# Patient Record
Sex: Male | Born: 1945 | Race: White | Hispanic: No | Marital: Married | Smoking: Former smoker
Health system: Southern US, Community
[De-identification: ages and names within clinical notes are randomized; demographics above are authoritative.]

## PROBLEM LIST (undated history)

## (undated) DIAGNOSIS — I10 Essential (primary) hypertension: Secondary | ICD-10-CM

## (undated) DIAGNOSIS — E7800 Pure hypercholesterolemia, unspecified: Secondary | ICD-10-CM

## (undated) DIAGNOSIS — C801 Malignant (primary) neoplasm, unspecified: Secondary | ICD-10-CM

## (undated) HISTORY — PX: PROSTATE SURGERY: SHX751

## (undated) HISTORY — PX: HERNIA REPAIR: SHX51

## (undated) HISTORY — PX: TOTAL HIP ARTHROPLASTY: SHX124

## (undated) HISTORY — PX: REPLACEMENT TOTAL KNEE: SUR1224

---

## 2010-05-30 DIAGNOSIS — M75120 Complete rotator cuff tear or rupture of unspecified shoulder, not specified as traumatic: Secondary | ICD-10-CM | POA: Insufficient documentation

## 2010-07-10 DIAGNOSIS — M25519 Pain in unspecified shoulder: Secondary | ICD-10-CM | POA: Insufficient documentation

## 2010-08-16 DIAGNOSIS — M779 Enthesopathy, unspecified: Secondary | ICD-10-CM | POA: Insufficient documentation

## 2010-08-23 DIAGNOSIS — M67919 Unspecified disorder of synovium and tendon, unspecified shoulder: Secondary | ICD-10-CM | POA: Insufficient documentation

## 2011-09-16 DIAGNOSIS — D235 Other benign neoplasm of skin of trunk: Secondary | ICD-10-CM | POA: Insufficient documentation

## 2014-11-24 DEATH — deceased

## 2015-02-17 ENCOUNTER — Emergency Department (HOSPITAL_COMMUNITY): Payer: Medicare Other

## 2015-02-17 ENCOUNTER — Emergency Department (HOSPITAL_COMMUNITY)
Admission: EM | Admit: 2015-02-17 | Discharge: 2015-02-17 | Disposition: A | Discharge status: Home / Self Care | Payer: Medicare Other | Attending: Emergency Medicine | Admitting: Emergency Medicine

## 2015-02-17 ENCOUNTER — Encounter (HOSPITAL_COMMUNITY): Payer: Self-pay | Admitting: Emergency Medicine

## 2015-02-17 DIAGNOSIS — R109 Unspecified abdominal pain: Secondary | ICD-10-CM | POA: Diagnosis present

## 2015-02-17 DIAGNOSIS — N201 Calculus of ureter: Secondary | ICD-10-CM | POA: Diagnosis not present

## 2015-02-17 DIAGNOSIS — N23 Unspecified renal colic: Secondary | ICD-10-CM | POA: Diagnosis not present

## 2015-02-17 DIAGNOSIS — I10 Essential (primary) hypertension: Secondary | ICD-10-CM | POA: Diagnosis not present

## 2015-02-17 DIAGNOSIS — E7800 Pure hypercholesterolemia, unspecified: Secondary | ICD-10-CM | POA: Insufficient documentation

## 2015-02-17 DIAGNOSIS — Z7982 Long term (current) use of aspirin: Secondary | ICD-10-CM | POA: Insufficient documentation

## 2015-02-17 DIAGNOSIS — Z79899 Other long term (current) drug therapy: Secondary | ICD-10-CM | POA: Insufficient documentation

## 2015-02-17 DIAGNOSIS — Z8546 Personal history of malignant neoplasm of prostate: Secondary | ICD-10-CM | POA: Diagnosis not present

## 2015-02-17 HISTORY — DX: Essential (primary) hypertension: I10

## 2015-02-17 HISTORY — DX: Malignant (primary) neoplasm, unspecified: C80.1

## 2015-02-17 HISTORY — DX: Pure hypercholesterolemia, unspecified: E78.00

## 2015-02-17 LAB — LIPASE, BLOOD: LIPASE: 23 U/L (ref 11–51)

## 2015-02-17 LAB — CBC WITH DIFFERENTIAL/PLATELET
BASOS ABS: 0 10*3/uL (ref 0.0–0.1)
Basophils Relative: 0 %
Eosinophils Absolute: 0 10*3/uL (ref 0.0–0.7)
Eosinophils Relative: 0 %
HEMATOCRIT: 43.5 % (ref 39.0–52.0)
Hemoglobin: 15 g/dL (ref 13.0–17.0)
LYMPHS PCT: 8 %
Lymphs Abs: 1.1 10*3/uL (ref 0.7–4.0)
MCH: 32.3 pg (ref 26.0–34.0)
MCHC: 34.5 g/dL (ref 30.0–36.0)
MCV: 93.5 fL (ref 78.0–100.0)
MONO ABS: 0.5 10*3/uL (ref 0.1–1.0)
Monocytes Relative: 4 %
NEUTROS ABS: 11.1 10*3/uL — AB (ref 1.7–7.7)
Neutrophils Relative %: 88 %
Platelets: 210 10*3/uL (ref 150–400)
RBC: 4.65 MIL/uL (ref 4.22–5.81)
RDW: 13.2 % (ref 11.5–15.5)
WBC: 12.8 10*3/uL — AB (ref 4.0–10.5)

## 2015-02-17 LAB — URINALYSIS, ROUTINE W REFLEX MICROSCOPIC
Bilirubin Urine: NEGATIVE
GLUCOSE, UA: NEGATIVE mg/dL
Hgb urine dipstick: NEGATIVE
KETONES UR: NEGATIVE mg/dL
LEUKOCYTES UA: NEGATIVE
NITRITE: NEGATIVE
PROTEIN: NEGATIVE mg/dL
Specific Gravity, Urine: 1.02 (ref 1.005–1.030)
pH: 8 (ref 5.0–8.0)

## 2015-02-17 LAB — COMPREHENSIVE METABOLIC PANEL
ALT: 41 U/L (ref 17–63)
AST: 42 U/L — AB (ref 15–41)
Albumin: 5 g/dL (ref 3.5–5.0)
Alkaline Phosphatase: 52 U/L (ref 38–126)
Anion gap: 11 (ref 5–15)
BUN: 17 mg/dL (ref 6–20)
CHLORIDE: 104 mmol/L (ref 101–111)
CO2: 27 mmol/L (ref 22–32)
Calcium: 9.8 mg/dL (ref 8.9–10.3)
Creatinine, Ser: 0.83 mg/dL (ref 0.61–1.24)
GFR calc Af Amer: >60 mL/min (ref >60)
Glucose, Bld: 116 mg/dL — ABNORMAL HIGH (ref 65–99)
POTASSIUM: 3.9 mmol/L (ref 3.5–5.1)
SODIUM: 142 mmol/L (ref 135–145)
Total Bilirubin: 1 mg/dL (ref 0.3–1.2)
Total Protein: 8.3 g/dL — ABNORMAL HIGH (ref 6.5–8.1)

## 2015-02-17 MED ORDER — KETOROLAC TROMETHAMINE 30 MG/ML IJ SOLN
15.0000 mg | Freq: Once | INTRAMUSCULAR | Status: AC
  Administered 2015-02-17: 15 mg via INTRAVENOUS
  Filled 2015-02-17: qty 1

## 2015-02-17 MED ORDER — OXYCODONE-ACETAMINOPHEN 5-325 MG PO TABS: ORAL_TABLET | ORAL | Status: DC

## 2015-02-17 MED ORDER — MORPHINE SULFATE (PF) 2 MG/ML IV SOLN: 1.0000 mg | INTRAVENOUS | Status: DC | PRN

## 2015-02-17 MED ORDER — MORPHINE SULFATE (PF) 4 MG/ML IV SOLN
4.0000 mg | INTRAVENOUS | Status: DC | PRN
  Filled 2015-02-17: qty 1

## 2015-02-17 MED ORDER — TAMSULOSIN HCL 0.4 MG PO CAPS: 0.4000 mg | ORAL_CAPSULE | Freq: Every day | ORAL | Status: DC

## 2015-02-17 MED ORDER — ONDANSETRON HCL 4 MG/2ML IJ SOLN: 4.0000 mg | INTRAMUSCULAR | Status: DC | PRN

## 2015-02-17 MED ORDER — ONDANSETRON HCL 4 MG PO TABS: 4.0000 mg | ORAL_TABLET | Freq: Three times a day (TID) | ORAL | Status: DC | PRN

## 2015-02-17 MED ORDER — ONDANSETRON HCL 4 MG/2ML IJ SOLN
4.0000 mg | INTRAMUSCULAR | Status: DC | PRN
  Administered 2015-02-17: 4 mg via INTRAVENOUS
  Filled 2015-02-17: qty 2

## 2015-02-17 NOTE — ED Notes (Signed)
Pt states that last night he was having have some low back pain, walked around and it went away.  States that it came on suddenly on the left flank this morning.  Also became nauseated and vomited x1.

## 2015-02-17 NOTE — Discharge Instructions (Signed)
°Emergency Department Resource Guide °1) Find a Doctor and Pay Out of Pocket °Although you won't have to find out who is covered by your insurance plan, it is a good idea to ask around and get recommendations. You will then need to call the office and see if the doctor you have chosen will accept you as a new patient and what types of options they offer for patients who are self-pay. Some doctors offer discounts or will set up payment plans for their patients who do not have insurance, but you will need to ask so you aren't surprised when you get to your appointment. ° °2) Contact Your Local Health Department °Not all health departments have doctors that can see patients for sick visits, but many do, so it is worth a call to see if yours does. If you don't know where your local health department is, you can check in your phone book. The CDC also has a tool to help you locate your state's health department, and many state websites also have listings of all of their local health departments. ° °3) Find a Walk-in Clinic °If your illness is not likely to be very severe or complicated, you may want to try a walk in clinic. These are popping up all over the country in pharmacies, drugstores, and shopping centers. They're usually staffed by nurse practitioners or physician assistants that have been trained to treat common illnesses and complaints. They're usually fairly quick and inexpensive. However, if you have serious medical issues or chronic medical problems, these are probably not your best option. ° °No Primary Care Doctor: °- Call [facility] at  832-8000 - they can help you locate a primary care doctor that  accepts your insurance, provides certain services, etc. °- Physician Referral Service- 1-800-533-3463 ° °Chronic Pain Problems: °Organization         Address  Phone   Notes  °[facility]  (336) 297-2271 Patients need to be referred by their primary care doctor.  ° °Medication  Assistance: °Organization         Address  Phone   Notes  °[place] [facility] [address] °[place], [place] [address] (336) 641-8030 --Must be a resident of [place] °-- Must have NO insurance coverage whatsoever (no Medicaid/ Medicare, etc.) °-- The pt. MUST have a primary care doctor that directs their care regularly and follows them in the community °  °[facility]  (866) 331-1348   °[employer]  (888) 892-1162   ° °Agencies that provide inexpensive medical care: °Organization         Address  Phone   Notes  °[facility]  (336) 832-8035   °[facility]    (336) 832-7272   °[facility] [address] °[place], [place] [address] (336) 832-4777   °[facility] [address], °[place] (336) 271-4999   °[facility]    (336) 373-0678   °[facility]    (336) 272-1050   °[facility] ° [address], [place] Phone:  (336) 832-4444, Fax:  (336) 832-4440 Hours of Operation:  9 am - 6 pm, M-F.  Also accepts Medicaid/Medicare and self-pay.  °[facility] ° [address], [place] Phone: (336) 832-3150, Fax: (336) 832-3151. Hours of Operation:  8:30 am - 5:30 pm, M-F.  Also accepts Medicaid and self-pay.  °[facility] [place] [address], [place] Phone: (336) 878-6027   °[facility] [address], [place], [place] (336)723-1848, Ext. 123 Mondays & Thursdays: 7-9 AM.  First 15 patients are seen on a first come, first serve basis. °  ° °Medicaid-accepting [place] Providers: ° °Organization         Address  Phone   Notes  °[facility] [address], [place] (336) 641-2100 Also accepts self-pay patients.  °[facility] [address], [place] ° (336) 856-9996   °[facility] [address], [place]  (336) 288-8857   °[facility] [address] [place] [address], [place] (336) 299-7000   °Veita Bland [address], [place]  ° (336) 373-1557 Only accepts [place] Access Medicaid patients after they have their name applied to their card.  ° °Self-Pay (no insurance) in [place]: ° °Organization         Address  Phone   Notes  °Sickle Cell Patients, [facility] [address], [place] (336) 832-1970   °[facility] [address], [place] (336) 832-4400   °[facility] ° [address], [place] (336) 992-4800   °[facility]/Dr. Osei-Bonsu ° [address] [place] [address], [place] or [address], [place] (336) 841-8500 Phone number for both [place] and [place] locations is the same.  °[facility] [address], [place] (336) 299-0000   °[facility] [address] [place] [address], [place] or [address] (336) 852-7530 °(336) 878-2260   °[facility] [address], [place] (336) 350-1642, phone; (336) 294-5005, fax Sees patients 1st and 3rd Saturday of every month.  Must not qualify for public or private insurance (i.e. Medicaid, Medicare, [place] Health Choice, Veterans' Benefits) • Household income should be no more than 200% of the poverty level •The clinic cannot treat you if you are pregnant or think you are pregnant • Sexually transmitted diseases are not treated at the clinic.  ° ° °Dental Care: °Organization         Address  Phone  Notes  °[place] [facility] [address], [place] (336) 641-6152 Accepts children up to age 21 who are enrolled in Medicaid or [place] Health Choice; pregnant women with a Medicaid card; and children who have applied for Medicaid or [place] Health Choice, but were declined, whose parents can pay a reduced fee at time of service.  °[place]  [facility] [place]  [address], [place] (336) 641-7733 Accepts children up to age 21 who are enrolled in Medicaid or [place] Health Choice; pregnant women with a Medicaid card; and children who have applied for Medicaid or [place] Health Choice, but were declined, whose parents can pay a reduced fee at time of service.  °[facility] ° [address],  (336) 641-4533 Patients are seen by appointment only. Walk-ins are not accepted. [facility] will see patients 18 years of age and older. °Monday - Tuesday (8am-5pm) °Most Wednesdays (8:30-5pm) °$30 per visit, cash only  °[facility] ° [address], [place] (336) 641-4533 Patients are seen by appointment only. Walk-ins are not accepted. [facility] will see patients 18 years of age and older. °One   Wednesday Evening (Monthly: Volunteer Based).  $30 per visit, cash only  °[facility]  (919) 537-3737 for adults; Children under age 4, call Graduate Pediatric Dentistry at (919) [phone]. Children aged 4-14, please call (919) [phone] to request a pediatric application. ° Dental services are provided in all areas of dental care including fillings, crowns and bridges, complete and partial dentures, implants, gum treatment, root canals, and extractions. Preventive care is also provided. Treatment is provided to both adults and children. °Patients are selected via a lottery and there is often a waiting list. °  °[facility] [address], °[place] ° (336) 763-8833 [email or web address] °  °[facility] [address], [place], [place] (336)723-1848, Ext. 123 Second and Fourth Thursday of each month, opens at 6:30 AM; Clinic ends at 9 AM.  Patients are seen on a first-come first-served basis, and a limited number are seen during each clinic.  ° °[facility] ° [address], [place], [place] (336) 723-7904    Eligibility Requirements °You must have lived in [place], [place], or [place] counties for at least the last three months. °  You cannot be eligible for state or federal sponsored healthcare insurance, including Veterans Administration, Medicaid, or Medicare. °  You generally cannot be eligible for healthcare insurance through your employer.  °  How to apply: °Eligibility screenings are held every Tuesday and Wednesday afternoon from 1:00 pm until 4:00 pm. You do not need an appointment for the interview!  °[facility] [address], [place], [place] 336-631-2330   °[facility]  336-342-8273   °[facility]  336-703-3100   °[facility]  336-570-6415   ° °Behavioral Health Resources in the Community: °Intensive Outpatient Programs °Organization         Address  Phone  Notes  °[facility] [address], [place], [place] 336-878-6098   °[facility] [address], [place], [place] 336-832-9800   °[facility] [address], [place], [place] ° 336-882-2125   °[facility] [address],  °[place], [place] 1-800-853-5163 or 336-641-4981   °Substance Abuse Resources °Organization         Address  Phone  Notes  °[facility]  336-882-2125   °[facility]  336-784-9470   °[facility]  336-285-9073   °[facility]  336-845-3988   °Residential & Outpatient Substance Abuse Program  1-800-659-3381   °Psychological Services °Organization         Address  Phone  Notes  °[facility]  336- 832-9600   °[facility]  336- 378-7881   °[facility] [address], [place] 1-800-853-5163 or 336-641-4981   ° °Mobile Crisis Teams °Organization         Address  Phone  Notes  °[facility], Mobile Crisis Care Unit  1-877-626-1772   °Assertive °[facility] ° [address]  [place], [place] 336-834-9664   °Sharon DeEsch [address] °[place] 336-554-5454   ° °Self-Help/Support Groups °Organization         Address  Phone             Notes  °[facility] [place] - variety of support groups  336- 373-1402 Call for more information  °Narcotics Anonymous (NA), [facility] [address], °[place]   2 meetings at this location  ° °  Residential Treatment Programs Organization         Address  Phone  Notes  [facility] [address],    [place]  1-479-228-1678   [facility]  [address], [place] [id number], [place], [place]   [place], [place] 330-460-9025 Admissions: 8am-3pm M-F  [facility] [address],    [place], [place] [phone]   [facility] [address] [place], [place], [place]   [facility] [address],  [place], [place]   [facility] Intensive Outpatient [address], [place], [place]   [facility]  [place], [place] 1-640-801-7903 or (301) 408-1283   [facility] [address], [place], [place] Accepts Medicaid  [facility] [address]  [place] 1-502-004-2231 Substance Abuse/Addiction Treatment   [facility]         Address  Phone  Notes  [facility]  662-596-2468   Domenic Schwab, PhD [address] [place]a [place], [place]   (204) 030-1048 or 848-680-0206   [facility] [place], [place] 765-174-0018   [facility] [address] [place], [place] [phone] Insurance/Medicaid/sponsorship through [facility] and Families [address]                                    [place], [place] [phone] [facility] [address], [place] [phone]    Dr. Adele Schilder  [phone]   [facility] Dept. 1) [address] [place] 2) [place] 3)  [address], [place] [phone]  [phone]   [place] 805-209-7388 or [phone] (After Hours)      Take the prescriptions as directed.  Also take over the counter ibuprofen, 2 tablets by mouth every 4 hours with food, for the next week.  Call the Urologist Monday to schedule a follow up appointment next week.  Return to the Emergency Department immediately if worsening.

## 2015-02-17 NOTE — ED Provider Notes (Signed)
CSN: [id number]     Arrival date & time 02/17/15  1135 History   First MD Initiated Contact with Patient 02/17/15 1207     Chief Complaint  Patient presents with  . Flank Pain      HPI  Pt was seen at 1220. Per pt, c/o sudden onset and persistence of waxing and waning left sided flank "pain" that began last night.  Pt describes the pain as "aching," and radiating into the left side of his abd.  Has been associated with multiple intermittent episodes of N/V.  Denies testicular pain/swelling, no dysuria/hematuria, no abd pain, no diarrhea, no black or blood in emesis, no CP/SOB, no fevers, no rash.    Past Medical History  Diagnosis Date  . High cholesterol   . Hypertension   . Cancer [facility])     prostate   Past Surgical History  Procedure Laterality Date  . Hernia repair    . Replacement total knee    . Prostate surgery      Social History  Substance Use Topics  . Smoking status: Never Smoker   . Smokeless tobacco: None  . Alcohol Use: Yes     Comment: occasional    Review of Systems ROS: Statement: All systems negative except as marked or noted in the HPI; Constitutional: Negative for fever and chills. ; ; Eyes: Negative for eye pain, redness and discharge. ; ; ENMT: Negative for ear pain, hoarseness, nasal congestion, sinus pressure and sore throat. ; ; Cardiovascular: Negative for chest pain, palpitations, diaphoresis, dyspnea and peripheral edema. ; ; Respiratory: Negative for cough, wheezing and stridor. ; ; Gastrointestinal: +N/V. Negative for diarrhea, abdominal pain, blood in stool, hematemesis, jaundice and rectal bleeding. . ; ; Genitourinary: +flank pain. Negative for dysuria and hematuria. ; Genital:  No penile drainage or rash, no testicular pain or swelling, no scrotal rash or swelling. ;; ; Musculoskeletal: Negative for neck pain. Negative for swelling and trauma.; ; Skin: Negative for pruritus, rash, abrasions, blisters, bruising and skin lesion.; ; Neuro: Negative  for headache, lightheadedness and neck stiffness. Negative for weakness, altered level of consciousness , altered mental status, extremity weakness, paresthesias, involuntary movement, seizure and syncope.      Allergies  Dilaudid  Home Medications   Prior to Admission medications   Medication Sig Start Date End Date Taking? Authorizing Provider  amLODipine (NORVASC) 5 MG tablet Take 5 mg by mouth daily.  12/25/14  Yes Historical Provider, MD  aspirin EC 81 MG tablet Take 81 mg by mouth daily.   Yes Historical Provider, MD  Omega-3 Fatty Acids (FISH OIL PO) Take 1 capsule by mouth daily.   Yes Historical Provider, MD  VYTORIN 10-40 MG tablet Take 1 tablet by mouth daily.  01/09/15  Yes Historical Provider, MD   BP 145/90 mmHg  Pulse 54  Temp(Src) 97.6 F (36.4 C) (Oral)  Resp 18  Ht 6' (1.829 m)  Wt 195 lb (88.451 kg)  BMI 26.44 kg/m2  SpO2 100% Physical Exam  1225: Physical examination:  Nursing notes reviewed; Vital signs and O2 SAT reviewed;  Constitutional: Well developed, Well nourished, Well hydrated, Uncomfortable appearing.; Head:  Normocephalic, atraumatic; Eyes: EOMI, PERRL, No scleral icterus; ENMT: Mouth and pharynx normal, Mucous membranes moist; Neck: Supple, Full range of motion, No lymphadenopathy; Cardiovascular: Regular rate and rhythm, No gallop; Respiratory: Breath sounds clear & equal bilaterally, No wheezes.  Speaking full sentences with ease, Normal respiratory effort/excursion; Chest: Nontender, Movement normal; Abdomen: Soft, Nontender, Nondistended, Normal  bowel sounds; Genitourinary: No CVA tenderness; Spine:  No midline CS, TS, LS tenderness.;; Extremities: Pulses normal, No tenderness, No edema, No calf edema or asymmetry.; Neuro: AA&Ox3, Major CN grossly intact.  Speech clear. No gross focal motor or sensory deficits in extremities.; Skin: Color normal, Warm, Dry.   ED Course  Procedures (including critical care time) Labs Review   Imaging Review  I  have personally reviewed and evaluated these images and lab results as part of my medical decision-making.   EKG Interpretation None      MDM  MDM Reviewed: previous chart, nursing note and vitals Reviewed previous: labs Interpretation: labs and CT scan     Results for orders placed or performed during the hospital encounter of 02/17/15  Urinalysis, Routine w reflex microscopic  Result Value Ref Range   Color, Urine YELLOW YELLOW   APPearance CLEAR CLEAR   Specific Gravity, Urine 1.020 1.005 - 1.030   pH 8.0 5.0 - 8.0   Glucose, UA NEGATIVE NEGATIVE mg/dL   Hgb urine dipstick NEGATIVE NEGATIVE   Bilirubin Urine NEGATIVE NEGATIVE   Ketones, ur NEGATIVE NEGATIVE mg/dL   Protein, ur NEGATIVE NEGATIVE mg/dL   Nitrite NEGATIVE NEGATIVE   Leukocytes, UA NEGATIVE NEGATIVE  Comprehensive metabolic panel  Result Value Ref Range   Sodium 142 135 - 145 mmol/L   Potassium 3.9 3.5 - 5.1 mmol/L   Chloride 104 101 - 111 mmol/L   CO2 27 22 - 32 mmol/L   Glucose, Bld 116 (H) 65 - 99 mg/dL   BUN 17 6 - 20 mg/dL   Creatinine, Ser 0.83 0.61 - 1.24 mg/dL   Calcium 9.8 8.9 - 10.3 mg/dL   Total Protein 8.3 (H) 6.5 - 8.1 g/dL   Albumin 5.0 3.5 - 5.0 g/dL   AST 42 (H) 15 - 41 U/L   ALT 41 17 - 63 U/L   Alkaline Phosphatase 52 38 - 126 U/L   Total Bilirubin 1.0 0.3 - 1.2 mg/dL   GFR calc non Af Amer >60 >60 mL/min   GFR calc Af Amer >60 >60 mL/min   Anion gap 11 5 - 15  Lipase, blood  Result Value Ref Range   Lipase 23 11 - 51 U/L  CBC with Differential  Result Value Ref Range   WBC 12.8 (H) 4.0 - 10.5 K/uL   RBC 4.65 4.22 - 5.81 MIL/uL   Hemoglobin 15.0 13.0 - 17.0 g/dL   HCT 43.5 39.0 - 52.0 %   MCV 93.5 78.0 - 100.0 fL   MCH 32.3 26.0 - 34.0 pg   MCHC 34.5 30.0 - 36.0 g/dL   RDW 13.2 11.5 - 15.5 %   Platelets 210 150 - 400 K/uL   Neutrophils Relative % 88 %   Neutro Abs 11.1 (H) 1.7 - 7.7 K/uL   Lymphocytes Relative 8 %   Lymphs Abs 1.1 0.7 - 4.0 K/uL   Monocytes Relative 4  %   Monocytes Absolute 0.5 0.1 - 1.0 K/uL   Eosinophils Relative 0 %   Eosinophils Absolute 0.0 0.0 - 0.7 K/uL   Basophils Relative 0 %   Basophils Absolute 0.0 0.0 - 0.1 K/uL   Ct Renal Stone Study 02/17/2015  CLINICAL DATA:  Acute left flank pain. EXAM: CT ABDOMEN AND PELVIS WITHOUT CONTRAST TECHNIQUE: Multidetector CT imaging of the abdomen and pelvis was performed following the standard protocol without IV contrast. COMPARISON:  None. 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 Visualized lung  bases are unremarkable. Mild cholelithiasis is noted. Multiple hepatic cysts are noted. The spleen and pancreas appear normal. Adrenal glands appear normal. Large simple cyst is seen involving lower pole of right kidney. Bilateral nephrolithiasis is noted. Mild left hydroureteronephrosis with perinephric stranding is noted secondary to 4 mm distal left ureteral calculus. The appendix appears normal. Atherosclerosis of abdominal aorta is noted without aneurysm formation. No abnormal fluid collection is noted. There is no evidence of bowel obstruction. Sigmoid diverticulosis is noted without inflammation. Urinary bladder appears normal. No significant adenopathy is noted. IMPRESSION: Mild cholelithiasis. Atherosclerosis of abdominal aorta without aneurysm formation. Bilateral nephrolithiasis. Mild left hydroureteronephrosis with perinephric stranding is noted secondary to 4 mm distal left ureteral calculus. Electronically Signed   By: Marijo Conception, M.D.   On: 02/17/2015 12:59    1345:  Pt continues to c/o pain and nausea. Apparently refused pain meds "because I'm scared of them." Pt encouraged to take IV pain/nausea meds to initally control his symptoms, then will be able to tol PO meds. Pt and family verb understanding and will accept very low dose IV morphine. Will also re-dose IV zofran with IV toradol.   1450:  Pt feels better after meds and wants to go home now. Has  tol PO well without N/V. Tx symptomatically at this time. Dx and testing d/w pt and family.  Questions answered.  Verb understanding, agreeable to d/c home with outpt f/u.   Francine Graven, DO 02/21/15 1913

## 2017-06-12 DIAGNOSIS — I10 Essential (primary) hypertension: Secondary | ICD-10-CM | POA: Insufficient documentation

## 2017-06-12 DIAGNOSIS — E7849 Other hyperlipidemia: Secondary | ICD-10-CM | POA: Insufficient documentation

## 2017-11-28 DIAGNOSIS — Z96653 Presence of artificial knee joint, bilateral: Secondary | ICD-10-CM | POA: Insufficient documentation

## 2019-01-19 LAB — HM HEPATITIS C SCREENING LAB: HM Hepatitis Screen: NEGATIVE

## 2019-08-05 DIAGNOSIS — R200 Anesthesia of skin: Secondary | ICD-10-CM | POA: Insufficient documentation

## 2019-08-05 DIAGNOSIS — G8929 Other chronic pain: Secondary | ICD-10-CM | POA: Insufficient documentation

## 2019-10-18 DIAGNOSIS — G5603 Carpal tunnel syndrome, bilateral upper limbs: Secondary | ICD-10-CM | POA: Insufficient documentation

## 2019-10-18 DIAGNOSIS — M4802 Spinal stenosis, cervical region: Secondary | ICD-10-CM | POA: Insufficient documentation

## 2019-12-05 DIAGNOSIS — M7502 Adhesive capsulitis of left shoulder: Secondary | ICD-10-CM | POA: Insufficient documentation

## 2019-12-05 DIAGNOSIS — M67922 Unspecified disorder of synovium and tendon, left upper arm: Secondary | ICD-10-CM | POA: Insufficient documentation

## 2019-12-05 DIAGNOSIS — M4802 Spinal stenosis, cervical region: Secondary | ICD-10-CM | POA: Insufficient documentation

## 2019-12-05 DIAGNOSIS — M50122 Cervical disc disorder at C5-C6 level with radiculopathy: Secondary | ICD-10-CM | POA: Insufficient documentation

## 2020-09-05 ENCOUNTER — Other Ambulatory Visit: Payer: Self-pay | Admitting: Neurosurgery

## 2020-09-05 DIAGNOSIS — G9520 Unspecified cord compression: Secondary | ICD-10-CM

## 2020-09-10 ENCOUNTER — Ambulatory Visit
Admission: RE | Admit: 2020-09-10 | Discharge: 2020-09-10 | Disposition: A | Discharge status: Home / Self Care | Payer: Medicare Other | Source: Ambulatory Visit | Attending: Neurosurgery | Admitting: Neurosurgery

## 2020-09-10 DIAGNOSIS — G9520 Unspecified cord compression: Secondary | ICD-10-CM

## 2021-03-25 HISTORY — PX: OTHER SURGICAL HISTORY: SHX169

## 2021-05-22 LAB — LIPID PANEL
Cholesterol: 133 (ref 0–200)
HDL: 56 (ref 35–70)
LDL Cholesterol: 55
Triglycerides: 108 (ref 40–160)

## 2021-10-05 NOTE — Telephone Encounter (Signed)
NOTE NOT NEEDED ?

## 2021-12-25 ENCOUNTER — Encounter: Payer: Self-pay | Admitting: Internal Medicine

## 2021-12-25 ENCOUNTER — Ambulatory Visit (INDEPENDENT_AMBULATORY_CARE_PROVIDER_SITE_OTHER): Payer: Medicare Other | Admitting: Internal Medicine

## 2021-12-25 VITALS — BP 160/92 | HR 64 | Temp 98.1°F | Resp 16 | Ht 72.0 in | Wt 198.0 lb

## 2021-12-25 DIAGNOSIS — I10 Essential (primary) hypertension: Secondary | ICD-10-CM

## 2021-12-25 DIAGNOSIS — R9431 Abnormal electrocardiogram [ECG] [EKG]: Secondary | ICD-10-CM

## 2021-12-25 DIAGNOSIS — Z23 Encounter for immunization: Secondary | ICD-10-CM

## 2021-12-25 DIAGNOSIS — E785 Hyperlipidemia, unspecified: Secondary | ICD-10-CM | POA: Diagnosis not present

## 2021-12-25 DIAGNOSIS — I5032 Chronic diastolic (congestive) heart failure: Secondary | ICD-10-CM | POA: Diagnosis not present

## 2021-12-25 LAB — URINALYSIS, ROUTINE W REFLEX MICROSCOPIC
Bilirubin Urine: NEGATIVE
Ketones, ur: NEGATIVE
Leukocytes,Ua: NEGATIVE
Nitrite: NEGATIVE
Specific Gravity, Urine: 1.015 (ref 1.000–1.030)
Total Protein, Urine: NEGATIVE
Urine Glucose: NEGATIVE
Urobilinogen, UA: 0.2 (ref 0.0–1.0)
WBC, UA: NONE SEEN (ref 0–?)
pH: 6 (ref 5.0–8.0)

## 2021-12-25 LAB — CBC WITH DIFFERENTIAL/PLATELET
Basophils Absolute: 0.1 10*3/uL (ref 0.0–0.1)
Basophils Relative: 0.7 % (ref 0.0–3.0)
Eosinophils Absolute: 0.2 10*3/uL (ref 0.0–0.7)
Eosinophils Relative: 2.1 % (ref 0.0–5.0)
HCT: 43.4 % (ref 39.0–52.0)
Hemoglobin: 14.6 g/dL (ref 13.0–17.0)
Lymphocytes Relative: 25.4 % (ref 12.0–46.0)
Lymphs Abs: 1.8 10*3/uL (ref 0.7–4.0)
MCHC: 33.6 g/dL (ref 30.0–36.0)
MCV: 95 fl (ref 78.0–100.0)
Monocytes Absolute: 0.6 10*3/uL (ref 0.1–1.0)
Monocytes Relative: 8.5 % (ref 3.0–12.0)
Neutro Abs: 4.5 10*3/uL (ref 1.4–7.7)
Neutrophils Relative %: 63.3 % (ref 43.0–77.0)
Platelets: 195 10*3/uL (ref 150.0–400.0)
RBC: 4.57 Mil/uL (ref 4.22–5.81)
RDW: 13.3 % (ref 11.5–15.5)
WBC: 7.1 10*3/uL (ref 4.0–10.5)

## 2021-12-25 LAB — BASIC METABOLIC PANEL
BUN: 15 mg/dL (ref 6–23)
CO2: 22 mEq/L (ref 19–32)
Calcium: 9.8 mg/dL (ref 8.4–10.5)
Chloride: 105 mEq/L (ref 96–112)
Creatinine, Ser: 0.74 mg/dL (ref 0.40–1.50)
GFR: 87.71 mL/min (ref >60.00)
Glucose, Bld: 87 mg/dL (ref 70–99)
Potassium: 3.7 mEq/L (ref 3.5–5.1)
Sodium: 140 mEq/L (ref 135–145)

## 2021-12-25 LAB — HEPATIC FUNCTION PANEL
ALT: 30 U/L (ref 0–53)
AST: 31 U/L (ref 0–37)
Albumin: 4.8 g/dL (ref 3.5–5.2)
Alkaline Phosphatase: 44 U/L (ref 39–117)
Bilirubin, Direct: 0.1 mg/dL (ref 0.0–0.3)
Total Bilirubin: 0.7 mg/dL (ref 0.2–1.2)
Total Protein: 8 g/dL (ref 6.0–8.3)

## 2021-12-25 LAB — TSH: TSH: 3.64 u[IU]/mL (ref 0.35–5.50)

## 2021-12-25 MED ORDER — LOSARTAN POTASSIUM 50 MG PO TABS: 50.0000 mg | ORAL_TABLET | Freq: Every day | ORAL | 1 refills | Status: DC

## 2021-12-25 MED ORDER — AMLODIPINE BESYLATE 5 MG PO TABS: 5.0000 mg | ORAL_TABLET | Freq: Every day | ORAL | 1 refills | Status: DC

## 2021-12-25 NOTE — Patient Instructions (Signed)
Hypertension, Adult High blood pressure (hypertension) is when the force of blood pumping through the arteries is too strong. The arteries are the blood vessels that carry blood from the heart throughout the body. Hypertension forces the heart to work harder to pump blood and may cause arteries to become narrow or stiff. Untreated or uncontrolled hypertension can lead to a heart attack, heart failure, a stroke, kidney disease, and other problems. A blood pressure reading consists of a higher number over a lower number. Ideally, your blood pressure should be below 120/80. The first ("top") number is called the systolic pressure. It is a measure of the pressure in your arteries as your heart beats. The second ("bottom") number is called the diastolic pressure. It is a measure of the pressure in your arteries as the heart relaxes. What are the causes? The exact cause of this condition is not known. There are some conditions that result in high blood pressure. What increases the risk? Certain factors may make you more likely to develop high blood pressure. Some of these risk factors are under your control, including: Smoking. Not getting enough exercise or physical activity. Being overweight. Having too much fat, sugar, calories, or salt (sodium) in your diet. Drinking too much alcohol. Other risk factors include: Having a personal history of heart disease, diabetes, high cholesterol, or kidney disease. Stress. Having a family history of high blood pressure and high cholesterol. Having obstructive sleep apnea. Age. The risk increases with age. What are the signs or symptoms? High blood pressure may not cause symptoms. Very high blood pressure (hypertensive crisis) may cause: Headache. Fast or irregular heartbeats (palpitations). Shortness of breath. Nosebleed. Nausea and vomiting. Vision changes. Severe chest pain, dizziness, and seizures. How is this diagnosed? This condition is diagnosed by  measuring your blood pressure while you are seated, with your arm resting on a flat surface, your legs uncrossed, and your feet flat on the floor. The cuff of the blood pressure monitor will be placed directly against the skin of your upper arm at the level of your heart. Blood pressure should be measured at least twice using the same arm. Certain conditions can cause a difference in blood pressure between your right and left arms. If you have a high blood pressure reading during one visit or you have normal blood pressure with other risk factors, you may be asked to: Return on a different day to have your blood pressure checked again. Monitor your blood pressure at home for 1 week or longer. If you are diagnosed with hypertension, you may have other blood or imaging tests to help your health care provider understand your overall risk for other conditions. How is this treated? This condition is treated by making healthy lifestyle changes, such as eating healthy foods, exercising more, and reducing your alcohol intake. You may be referred for counseling on a healthy diet and physical activity. Your health care provider may prescribe medicine if lifestyle changes are not enough to get your blood pressure under control and if: Your systolic blood pressure is above 130. Your diastolic blood pressure is above 80. Your personal target blood pressure may vary depending on your medical conditions, your age, and other factors. Follow these instructions at home: Eating and drinking  Eat a diet that is high in fiber and potassium, and low in sodium, added sugar, and fat. An example of this eating plan is called the DASH diet. DASH stands for Dietary Approaches to Stop Hypertension. To eat this way: Eat   plenty of fresh fruits and vegetables. Try to fill one half of your plate at each meal with fruits and vegetables. Eat whole grains, such as whole-wheat pasta, brown rice, or whole-grain bread. Fill about one  fourth of your plate with whole grains. Eat or drink low-fat dairy products, such as skim milk or low-fat yogurt. Avoid fatty cuts of meat, processed or cured meats, and poultry with skin. Fill about one fourth of your plate with lean proteins, such as fish, chicken without skin, beans, eggs, or tofu. Avoid pre-made and processed foods. These tend to be higher in sodium, added sugar, and fat. Reduce your daily sodium intake. Many people with hypertension should eat less than 1,500 mg of sodium a day. Do not drink alcohol if: Your health care provider tells you not to drink. You are pregnant, may be pregnant, or are planning to become pregnant. If you drink alcohol: Limit how much you have to: 0-1 drink a day for women. 0-2 drinks a day for men. Know how much alcohol is in your drink. In the [place] one drink equals one 12 oz bottle of beer (355 mL), one 5 oz glass of wine (148 mL), or one 1 oz glass of hard liquor (44 mL). Lifestyle  Work with your health care provider to maintain a healthy body weight or to lose weight. Ask what an ideal weight is for you. Get at least 30 minutes of exercise that causes your heart to beat faster (aerobic exercise) most days of the week. Activities may include walking, swimming, or biking. Include exercise to strengthen your muscles (resistance exercise), such as Pilates or lifting weights, as part of your weekly exercise routine. Try to do these types of exercises for 30 minutes at least 3 days a week. Do not use any products that contain nicotine or tobacco. These products include cigarettes, chewing tobacco, and vaping devices, such as e-cigarettes. If you need help quitting, ask your health care provider. Monitor your blood pressure at home as told by your health care provider. Keep all follow-up visits. This is important. Medicines Take over-the-counter and prescription medicines only as told by your health care provider. Follow directions carefully. Blood  pressure medicines must be taken as prescribed. Do not skip doses of blood pressure medicine. Doing this puts you at risk for problems and can make the medicine less effective. Ask your health care provider about side effects or reactions to medicines that you should watch for. Contact a health care provider if you: Think you are having a reaction to a medicine you are taking. Have headaches that keep coming back (recurring). Feel dizzy. Have swelling in your ankles. Have trouble with your vision. Get help right away if you: Develop a severe headache or confusion. Have unusual weakness or numbness. Feel faint. Have severe pain in your chest or abdomen. Vomit repeatedly. Have trouble breathing. These symptoms may be an emergency. Get help right away. Call 911. Do not wait to see if the symptoms will go away. Do not drive yourself to the hospital. Summary Hypertension is when the force of blood pumping through your arteries is too strong. If this condition is not controlled, it may put you at risk for serious complications. Your personal target blood pressure may vary depending on your medical conditions, your age, and other factors. For most people, a normal blood pressure is less than 120/80. Hypertension is treated with lifestyle changes, medicines, or a combination of both. Lifestyle changes include losing weight, eating a healthy,   low-sodium diet, exercising more, and limiting alcohol. This information is not intended to replace advice given to you by your health care provider. Make sure you discuss any questions you have with your health care provider. Document Revised: 01/16/2021 Document Reviewed: 01/16/2021 Elsevier Patient Education  2023 Elsevier Inc.  

## 2021-12-25 NOTE — Progress Notes (Signed)
Subjective:  Patient ID: Jonathan Ritter, male    DOB: Oct 22, 1945  Age: 76 y.o. MRN: [id number]  CC: Hypertension   HPI Jonathan Ritter presents for establishing -  He does not exercise but when he is active he does not experience DOE, CP, edema, or palpitations.  History Jonathan Ritter has a past medical history of Cancer ([place]), High cholesterol, and Hypertension.   He has a past surgical history that includes Hernia repair; Replacement total knee; and Prostate surgery.   His family history includes Alcoholism in his brother and mother; CAD in his father; Stroke in his mother.He reports that he has never smoked. He does not have any smokeless tobacco history on file. He reports current alcohol use of about 7.0 standard drinks of alcohol per week. He reports that he does not use drugs.  Outpatient Medications Prior to Visit  Medication Sig Dispense Refill   aspirin EC 81 MG tablet Take 81 mg by mouth daily.     Omega-3 Fatty Acids (FISH OIL PO) Take 1 capsule by mouth daily.     VYTORIN 10-40 MG tablet Take 1 tablet by mouth daily.      amLODipine (NORVASC) 5 MG tablet Take 5 mg by mouth daily.      losartan (COZAAR) 25 MG tablet Take 25 mg by mouth daily.     ondansetron (ZOFRAN) 4 MG tablet Take 1 tablet (4 mg total) by mouth every 8 (eight) hours as needed for nausea or vomiting. 6 tablet 0   oxyCODONE-acetaminophen (PERCOCET/ROXICET) 5-325 MG tablet 1 or 2 tabs PO q6h prn pain 20 tablet 0   tamsulosin (FLOMAX) 0.4 MG CAPS capsule Take 1 capsule (0.4 mg total) by mouth at bedtime. 5 capsule 0   No facility-administered medications prior to visit.    ROS Review of Systems  Constitutional:  Negative for chills, diaphoresis, fatigue and fever.  HENT: Negative.    Eyes: Negative.   Respiratory:  Negative for cough, chest tightness, shortness of breath and wheezing.   Cardiovascular:  Negative for chest pain, palpitations and leg swelling.  Gastrointestinal:  Negative for abdominal pain,  constipation, diarrhea, nausea and vomiting.  Endocrine: Negative.   Genitourinary: Negative.  Negative for difficulty urinating.  Musculoskeletal:  Positive for arthralgias and neck pain. Negative for joint swelling and myalgias.  Skin: Negative.  Negative for color change.  Neurological:  Negative for dizziness, weakness, light-headedness, numbness and headaches.  Hematological:  Negative for adenopathy. Does not bruise/bleed easily.  Psychiatric/Behavioral: Negative.      Objective:  BP (!) 160/92 (BP Location: Right Arm, Patient Position: Sitting, Cuff Size: Large)   Pulse 64   Temp 98.1 F (36.7 C) (Oral)   Resp 16   Ht 6' (1.829 m)   Wt 198 lb (89.8 kg)   SpO2 97%   BMI 26.85 kg/m   Physical Exam Vitals reviewed.  HENT:     Nose: Nose normal.     Mouth/Throat:     Mouth: Mucous membranes are moist.  Eyes:     General: No scleral icterus.    Conjunctiva/sclera: Conjunctivae normal.  Cardiovascular:     Rate and Rhythm: Regular rhythm. Bradycardia present.     Heart sounds: Normal heart sounds, S1 normal and S2 normal. No murmur heard.    No gallop.     Comments: EKG- SB, 56 bpm Anterior infarct pattern No old EKG's to compare Pulmonary:     Effort: Pulmonary effort is normal.     Breath sounds: No stridor.  No wheezing, rhonchi or rales.  Musculoskeletal:     Right lower leg: No edema.     Left lower leg: No edema.  Neurological:     Mental Status: He is alert.      Lab Results  Component Value Date   WBC 7.1 12/25/2021   HGB 14.6 12/25/2021   HCT 43.4 12/25/2021   PLT 195.0 12/25/2021   GLUCOSE 87 12/25/2021   CHOL 133 05/22/2021   TRIG 108 05/22/2021   HDL 56 05/22/2021   LDLCALC 55 05/22/2021   ALT 30 12/25/2021   AST 31 12/25/2021   NA 140 12/25/2021   K 3.7 12/25/2021   CL 105 12/25/2021   CREATININE 0.74 12/25/2021   BUN 15 12/25/2021   CO2 22 12/25/2021   TSH 3.64 12/25/2021      Assessment & Plan:   Jonathan Ritter was seen today for  hypertension.  Diagnoses and all orders for this visit:  Flu vaccine need -     Flu Vaccine QUAD High Dose(Fluad)  Essential hypertension- His BP is not adequately well controlled. Will increase the ARB dose. -     EKG 12-Lead -     Basic metabolic panel; Future -     TSH; Future -     Urinalysis, Routine w reflex microscopic; Future -     CBC with Differential/Platelet; Future -     Hepatic function panel; Future -     Hepatic function panel -     CBC with Differential/Platelet -     Urinalysis, Routine w reflex microscopic -     TSH -     Basic metabolic panel -     amLODipine (NORVASC) 5 MG tablet; Take 1 tablet (5 mg total) by mouth daily. -     losartan (COZAAR) 50 MG tablet; Take 1 tablet (50 mg total) by mouth daily.  Hyperlipidemia with target LDL less than 130- LDL goal achieved. Doing well on the statin  -     Hepatic function panel; Future -     Hepatic function panel  Abnormal electrocardiogram (ECG) (EKG)- His ASCVD risk score is 39%. Will evaluate for ischemia. -     MYOCARDIAL PERFUSION IMAGING; Future -     Cardiac Stress Test: Informed Consent Details: Physician/Practitioner Attestation; Transcribe to consent form and obtain patient signature; Future  Abnormal electrocardiogram -     MYOCARDIAL PERFUSION IMAGING; Future -     Cardiac Stress Test: Informed Consent Details: Physician/Practitioner Attestation; Transcribe to consent form and obtain patient signature; Future   I have discontinued Jonathan Ritter's oxyCODONE-acetaminophen, tamsulosin, ondansetron, and losartan. I have also changed his amLODipine. Additionally, I am having him start on losartan. Lastly, I am having him maintain his Vytorin, aspirin EC, and Omega-3 Fatty Acids (FISH OIL PO).  Meds ordered this encounter  Medications   amLODipine (NORVASC) 5 MG tablet    Sig: Take 1 tablet (5 mg total) by mouth daily.    Dispense:  90 tablet    Refill:  1   losartan (COZAAR) 50 MG tablet    Sig: Take 1  tablet (50 mg total) by mouth daily.    Dispense:  90 tablet    Refill:  1     Follow-up: Return in about 3 months (around 03/27/2022).  Scarlette Calico, MD

## 2021-12-30 ENCOUNTER — Encounter: Payer: Self-pay | Admitting: Internal Medicine

## 2022-01-02 ENCOUNTER — Telehealth (HOSPITAL_COMMUNITY): Payer: Self-pay | Admitting: *Deleted

## 2022-01-02 NOTE — Telephone Encounter (Signed)
Close encounter 

## 2022-01-03 ENCOUNTER — Ambulatory Visit (HOSPITAL_COMMUNITY)
Admission: RE | Admit: 2022-01-03 | Discharge: 2022-01-03 | Disposition: A | Discharge status: Home / Self Care | Payer: Medicare Other | Source: Ambulatory Visit | Attending: Internal Medicine | Admitting: Internal Medicine

## 2022-01-03 DIAGNOSIS — R9431 Abnormal electrocardiogram [ECG] [EKG]: Secondary | ICD-10-CM | POA: Diagnosis present

## 2022-01-03 LAB — MYOCARDIAL PERFUSION IMAGING
LV dias vol: 133 mL (ref 62–150)
LV sys vol: 72 mL
Nuc Stress EF: 46 %
Peak HR: 83 {beats}/min
Rest HR: 58 {beats}/min
Rest Nuclear Isotope Dose: 11 mCi
SDS: 0
SRS: 1
SSS: 1
ST Depression (mm): 0 mm
Stress Nuclear Isotope Dose: 30.6 mCi
TID: 0.94

## 2022-01-03 MED ORDER — TECHNETIUM TC 99M TETROFOSMIN IV KIT
11.0000 | PACK | Freq: Once | INTRAVENOUS | Status: AC | PRN
  Administered 2022-01-03: 11 via INTRAVENOUS

## 2022-01-03 MED ORDER — TECHNETIUM TC 99M TETROFOSMIN IV KIT
30.6000 | PACK | Freq: Once | INTRAVENOUS | Status: AC | PRN
  Administered 2022-01-03: 30.6 via INTRAVENOUS

## 2022-01-03 MED ORDER — REGADENOSON 0.4 MG/5ML IV SOLN
0.4000 mg | Freq: Once | INTRAVENOUS | Status: AC
  Administered 2022-01-03: 0.4 mg via INTRAVENOUS

## 2022-01-04 ENCOUNTER — Encounter: Payer: Self-pay | Admitting: Internal Medicine

## 2022-01-04 ENCOUNTER — Telehealth (HOSPITAL_BASED_OUTPATIENT_CLINIC_OR_DEPARTMENT_OTHER): Payer: Self-pay | Admitting: *Deleted

## 2022-01-04 ENCOUNTER — Other Ambulatory Visit: Payer: Self-pay | Admitting: Internal Medicine

## 2022-01-04 DIAGNOSIS — R9430 Abnormal result of cardiovascular function study, unspecified: Secondary | ICD-10-CM

## 2022-01-04 DIAGNOSIS — R9431 Abnormal electrocardiogram [ECG] [EKG]: Secondary | ICD-10-CM

## 2022-01-04 NOTE — Telephone Encounter (Signed)
Left message for patient to call and discuss scheduling the Echocardiogram ordered by Dr. Scarlette Calico

## 2022-01-06 DIAGNOSIS — I10 Essential (primary) hypertension: Secondary | ICD-10-CM | POA: Insufficient documentation

## 2022-01-17 ENCOUNTER — Ambulatory Visit (INDEPENDENT_AMBULATORY_CARE_PROVIDER_SITE_OTHER): Payer: Medicare Other

## 2022-01-17 DIAGNOSIS — R9431 Abnormal electrocardiogram [ECG] [EKG]: Secondary | ICD-10-CM

## 2022-01-17 DIAGNOSIS — R9430 Abnormal result of cardiovascular function study, unspecified: Secondary | ICD-10-CM

## 2022-01-17 LAB — ECHOCARDIOGRAM COMPLETE
Area-P 1/2: 2.91 cm2
S' Lateral: 2.71 cm

## 2022-01-18 ENCOUNTER — Encounter: Payer: Self-pay | Admitting: Internal Medicine

## 2022-01-25 DIAGNOSIS — R9431 Abnormal electrocardiogram [ECG] [EKG]: Secondary | ICD-10-CM | POA: Insufficient documentation

## 2022-01-25 DIAGNOSIS — I5032 Chronic diastolic (congestive) heart failure: Secondary | ICD-10-CM | POA: Insufficient documentation

## 2022-03-21 ENCOUNTER — Ambulatory Visit (INDEPENDENT_AMBULATORY_CARE_PROVIDER_SITE_OTHER): Payer: Medicare Other | Admitting: Internal Medicine

## 2022-03-21 ENCOUNTER — Encounter: Payer: Self-pay | Admitting: Internal Medicine

## 2022-03-21 VITALS — BP 136/88 | HR 65 | Temp 97.8°F | Ht 72.0 in | Wt 204.0 lb

## 2022-03-21 DIAGNOSIS — I5032 Chronic diastolic (congestive) heart failure: Secondary | ICD-10-CM

## 2022-03-21 DIAGNOSIS — Z23 Encounter for immunization: Secondary | ICD-10-CM | POA: Diagnosis not present

## 2022-03-21 DIAGNOSIS — I10 Essential (primary) hypertension: Secondary | ICD-10-CM | POA: Diagnosis not present

## 2022-03-21 DIAGNOSIS — E785 Hyperlipidemia, unspecified: Secondary | ICD-10-CM

## 2022-03-21 MED ORDER — VYTORIN 10-40 MG PO TABS: 1.0000 | ORAL_TABLET | Freq: Every day | ORAL | 1 refills | Status: DC

## 2022-03-21 NOTE — Patient Instructions (Signed)
Hypertension, Adult High blood pressure (hypertension) is when the force of blood pumping through the arteries is too strong. The arteries are the blood vessels that carry blood from the heart throughout the body. Hypertension forces the heart to work harder to pump blood and may cause arteries to become narrow or stiff. Untreated or uncontrolled hypertension can lead to a heart attack, heart failure, a stroke, kidney disease, and other problems. A blood pressure reading consists of a higher number over a lower number. Ideally, your blood pressure should be below 120/80. The first ("top") number is called the systolic pressure. It is a measure of the pressure in your arteries as your heart beats. The second ("bottom") number is called the diastolic pressure. It is a measure of the pressure in your arteries as the heart relaxes. What are the causes? The exact cause of this condition is not known. There are some conditions that result in high blood pressure. What increases the risk? Certain factors may make you more likely to develop high blood pressure. Some of these risk factors are under your control, including: Smoking. Not getting enough exercise or physical activity. Being overweight. Having too much fat, sugar, calories, or salt (sodium) in your diet. Drinking too much alcohol. Other risk factors include: Having a personal history of heart disease, diabetes, high cholesterol, or kidney disease. Stress. Having a family history of high blood pressure and high cholesterol. Having obstructive sleep apnea. Age. The risk increases with age. What are the signs or symptoms? High blood pressure may not cause symptoms. Very high blood pressure (hypertensive crisis) may cause: Headache. Fast or irregular heartbeats (palpitations). Shortness of breath. Nosebleed. Nausea and vomiting. Vision changes. Severe chest pain, dizziness, and seizures. How is this diagnosed? This condition is diagnosed by  measuring your blood pressure while you are seated, with your arm resting on a flat surface, your legs uncrossed, and your feet flat on the floor. The cuff of the blood pressure monitor will be placed directly against the skin of your upper arm at the level of your heart. Blood pressure should be measured at least twice using the same arm. Certain conditions can cause a difference in blood pressure between your right and left arms. If you have a high blood pressure reading during one visit or you have normal blood pressure with other risk factors, you may be asked to: Return on a different day to have your blood pressure checked again. Monitor your blood pressure at home for 1 week or longer. If you are diagnosed with hypertension, you may have other blood or imaging tests to help your health care provider understand your overall risk for other conditions. How is this treated? This condition is treated by making healthy lifestyle changes, such as eating healthy foods, exercising more, and reducing your alcohol intake. You may be referred for counseling on a healthy diet and physical activity. Your health care provider may prescribe medicine if lifestyle changes are not enough to get your blood pressure under control and if: Your systolic blood pressure is above 130. Your diastolic blood pressure is above 80. Your personal target blood pressure may vary depending on your medical conditions, your age, and other factors. Follow these instructions at home: Eating and drinking  Eat a diet that is high in fiber and potassium, and low in sodium, added sugar, and fat. An example of this eating plan is called the DASH diet. DASH stands for Dietary Approaches to Stop Hypertension. To eat this way: Eat   plenty of fresh fruits and vegetables. Try to fill one half of your plate at each meal with fruits and vegetables. Eat whole grains, such as whole-wheat pasta, brown rice, or whole-grain bread. Fill about one  fourth of your plate with whole grains. Eat or drink low-fat dairy products, such as skim milk or low-fat yogurt. Avoid fatty cuts of meat, processed or cured meats, and poultry with skin. Fill about one fourth of your plate with lean proteins, such as fish, chicken without skin, beans, eggs, or tofu. Avoid pre-made and processed foods. These tend to be higher in sodium, added sugar, and fat. Reduce your daily sodium intake. Many people with hypertension should eat less than 1,500 mg of sodium a day. Do not drink alcohol if: Your health care provider tells you not to drink. You are pregnant, may be pregnant, or are planning to become pregnant. If you drink alcohol: Limit how much you have to: 0-1 drink a day for women. 0-2 drinks a day for men. Know how much alcohol is in your drink. In the [place] one drink equals one 12 oz bottle of beer (355 mL), one 5 oz glass of wine (148 mL), or one 1 oz glass of hard liquor (44 mL). Lifestyle  Work with your health care provider to maintain a healthy body weight or to lose weight. Ask what an ideal weight is for you. Get at least 30 minutes of exercise that causes your heart to beat faster (aerobic exercise) most days of the week. Activities may include walking, swimming, or biking. Include exercise to strengthen your muscles (resistance exercise), such as Pilates or lifting weights, as part of your weekly exercise routine. Try to do these types of exercises for 30 minutes at least 3 days a week. Do not use any products that contain nicotine or tobacco. These products include cigarettes, chewing tobacco, and vaping devices, such as e-cigarettes. If you need help quitting, ask your health care provider. Monitor your blood pressure at home as told by your health care provider. Keep all follow-up visits. This is important. Medicines Take over-the-counter and prescription medicines only as told by your health care provider. Follow directions carefully. Blood  pressure medicines must be taken as prescribed. Do not skip doses of blood pressure medicine. Doing this puts you at risk for problems and can make the medicine less effective. Ask your health care provider about side effects or reactions to medicines that you should watch for. Contact a health care provider if you: Think you are having a reaction to a medicine you are taking. Have headaches that keep coming back (recurring). Feel dizzy. Have swelling in your ankles. Have trouble with your vision. Get help right away if you: Develop a severe headache or confusion. Have unusual weakness or numbness. Feel faint. Have severe pain in your chest or abdomen. Vomit repeatedly. Have trouble breathing. These symptoms may be an emergency. Get help right away. Call 911. Do not wait to see if the symptoms will go away. Do not drive yourself to the hospital. Summary Hypertension is when the force of blood pumping through your arteries is too strong. If this condition is not controlled, it may put you at risk for serious complications. Your personal target blood pressure may vary depending on your medical conditions, your age, and other factors. For most people, a normal blood pressure is less than 120/80. Hypertension is treated with lifestyle changes, medicines, or a combination of both. Lifestyle changes include losing weight, eating a healthy,   low-sodium diet, exercising more, and limiting alcohol. This information is not intended to replace advice given to you by your health care provider. Make sure you discuss any questions you have with your health care provider. Document Revised: 01/16/2021 Document Reviewed: 01/16/2021 Elsevier Patient Education  2023 Elsevier Inc.  

## 2022-03-21 NOTE — Progress Notes (Signed)
Subjective:  Patient ID: Jonathan Ritter, male    DOB: [birth year]  Age: 76 y.o. MRN: [id number]  CC: Hypertension and Hyperlipidemia   HPI Jonathan Ritter presents for f/up -  He is active and denies DOE, CP, SOB, edema.  Outpatient Medications Prior to Visit  Medication Sig Dispense Refill   amLODipine (NORVASC) 5 MG tablet Take 1 tablet (5 mg total) by mouth daily. 90 tablet 1   aspirin EC 81 MG tablet Take 81 mg by mouth daily.     losartan (COZAAR) 50 MG tablet Take 1 tablet (50 mg total) by mouth daily. 90 tablet 1   Omega-3 Fatty Acids (FISH OIL PO) Take 1 capsule by mouth daily.     VYTORIN 10-40 MG tablet Take 1 tablet by mouth daily.      No facility-administered medications prior to visit.    ROS Review of Systems  Constitutional:  Negative for diaphoresis and fatigue.  HENT: Negative.    Eyes: Negative.   Respiratory: Negative.  Negative for cough, shortness of breath and wheezing.   Cardiovascular:  Negative for chest pain, palpitations and leg swelling.  Gastrointestinal:  Negative for abdominal pain, diarrhea, nausea and vomiting.  Endocrine: Negative.   Genitourinary: Negative.  Negative for difficulty urinating.  Musculoskeletal: Negative.  Negative for arthralgias and myalgias.  Skin: Negative.   Neurological: Negative.  Negative for dizziness.  Hematological:  Negative for adenopathy. Does not bruise/bleed easily.  Psychiatric/Behavioral: Negative.      Objective:  BP 136/88 (BP Location: Left Arm, Patient Position: Sitting, Cuff Size: Large)   Pulse 65   Temp 97.8 F (36.6 C) (Oral)   Ht 6' (1.829 m)   Wt 204 lb (92.5 kg)   SpO2 96%   BMI 27.67 kg/m   BP Readings from Last 3 Encounters:  03/21/22 136/88  12/25/21 (!) 160/92  02/17/15 120/74    Wt Readings from Last 3 Encounters:  03/21/22 204 lb (92.5 kg)  01/03/22 198 lb (89.8 kg)  12/25/21 198 lb (89.8 kg)    Physical Exam Vitals reviewed.  HENT:     Mouth/Throat:     Mouth: Mucous  membranes are moist.  Eyes:     General: No scleral icterus.    Conjunctiva/sclera: Conjunctivae normal.  Cardiovascular:     Rate and Rhythm: Normal rate and regular rhythm.     Heart sounds: No murmur heard. Pulmonary:     Effort: Pulmonary effort is normal.     Breath sounds: No stridor. No wheezing, rhonchi or rales.  Abdominal:     General: Abdomen is flat.     Palpations: There is no mass.     Tenderness: There is no abdominal tenderness. There is no guarding.     Hernia: No hernia is present.  Musculoskeletal:        General: No swelling. Normal range of motion.     Cervical back: Neck supple.     Right lower leg: No edema.     Left lower leg: No edema.  Lymphadenopathy:     Cervical: No cervical adenopathy.  Skin:    General: Skin is warm and dry.  Neurological:     General: No focal deficit present.     Mental Status: He is alert.  Psychiatric:        Mood and Affect: Mood normal.        Behavior: Behavior normal.     Lab Results  Component Value Date   WBC 7.1 12/25/2021  HGB 14.6 12/25/2021   HCT 43.4 12/25/2021   PLT 195.0 12/25/2021   GLUCOSE 87 12/25/2021   CHOL 133 05/22/2021   TRIG 108 05/22/2021   HDL 56 05/22/2021   LDLCALC 55 05/22/2021   ALT 30 12/25/2021   AST 31 12/25/2021   NA 140 12/25/2021   K 3.7 12/25/2021   CL 105 12/25/2021   CREATININE 0.74 12/25/2021   BUN 15 12/25/2021   CO2 22 12/25/2021   TSH 3.64 12/25/2021    MYOCARDIAL PERFUSION IMAGING  Result Date: 01/03/2022   Findings are consistent with no prior ischemia. The study is intermediate risk.   No ST deviation was noted.   LV perfusion is normal.   Left ventricular function is abnormal. Global function is mildly reduced. Nuclear stress EF: 46 %. The left ventricular ejection fraction is mildly decreased (45-54%). End diastolic cavity size is mildly enlarged. End systolic cavity size is mildly enlarged.   Prior study not available for comparison. No reversible ischemia.  LVEF 46% with basal to mid inferior wall hypokinesis (may be artifact d/t bowel attenuation) - recommend echo correlation. This is an intermediate risk study (d/t reduced LVEF). Clinical correlation is advised. No prior for comparison.    Assessment & Plan:   March was seen today for hypertension and hyperlipidemia.  Diagnoses and all orders for this visit:  Need for vaccination -     Pneumococcal conjugate vaccine 20-valent  Hyperlipidemia with target LDL less than 130- LDL goal achieved. Doing well on the statin  -     VYTORIN 10-40 MG tablet; Take 1 tablet by mouth daily.  Essential hypertension- His BP is well controlled.  Chronic heart failure with preserved ejection fraction ([place])- Will start an SGLT-2-inh. -     empagliflozin (JARDIANCE) 10 MG TABS tablet; Take 1 tablet (10 mg total) by mouth daily.   I am having Randa Lynn start on empagliflozin. I am also having him maintain his aspirin EC, Omega-3 Fatty Acids (FISH OIL PO), amLODipine, losartan, and Vytorin.  Meds ordered this encounter  Medications   VYTORIN 10-40 MG tablet    Sig: Take 1 tablet by mouth daily.    Dispense:  90 tablet    Refill:  1   empagliflozin (JARDIANCE) 10 MG TABS tablet    Sig: Take 1 tablet (10 mg total) by mouth daily.    Dispense:  90 tablet    Refill:  1     Follow-up: Return in about 6 months (around 09/20/2022).  Scarlette Calico, MD

## 2022-03-23 ENCOUNTER — Encounter: Payer: Self-pay | Admitting: Internal Medicine

## 2022-03-23 MED ORDER — EMPAGLIFLOZIN 10 MG PO TABS: 10.0000 mg | ORAL_TABLET | Freq: Every day | ORAL | 1 refills | Status: DC

## 2022-05-31 ENCOUNTER — Telehealth: Payer: Self-pay | Admitting: Internal Medicine

## 2022-05-31 ENCOUNTER — Other Ambulatory Visit: Payer: Self-pay | Admitting: Internal Medicine

## 2022-05-31 DIAGNOSIS — I10 Essential (primary) hypertension: Secondary | ICD-10-CM

## 2022-05-31 DIAGNOSIS — E785 Hyperlipidemia, unspecified: Secondary | ICD-10-CM

## 2022-05-31 MED ORDER — VYTORIN 10-40 MG PO TABS: 1.0000 | ORAL_TABLET | Freq: Every day | ORAL | 1 refills | Status: DC

## 2022-05-31 MED ORDER — AMLODIPINE BESYLATE 5 MG PO TABS: 5.0000 mg | ORAL_TABLET | Freq: Every day | ORAL | 1 refills | Status: DC

## 2022-05-31 NOTE — Telephone Encounter (Signed)
Prescription Request  05/31/2022  LOV: 03/21/2022  What is the name of the medication or equipment?  VYTORIN 10-40 MG tablet  amLODipine (NORVASC) 5 MG tablet   Have you contacted your pharmacy to request a refill? Yes  Which pharmacy would you like this sent to?  [place], [place] [address] Phone: [phone] Fax: [phone]   Patient notified that their request is being sent to the clinical staff for review and that they should receive a response within 2 business days.   Please advise at [facility] [phone]

## 2022-06-04 ENCOUNTER — Other Ambulatory Visit: Payer: Self-pay | Admitting: Internal Medicine

## 2022-06-04 ENCOUNTER — Encounter: Payer: Self-pay | Admitting: Internal Medicine

## 2022-06-04 DIAGNOSIS — E785 Hyperlipidemia, unspecified: Secondary | ICD-10-CM

## 2022-06-04 MED ORDER — ROSUVASTATIN CALCIUM 20 MG PO TABS: 20.0000 mg | ORAL_TABLET | Freq: Every day | ORAL | 1 refills | Status: DC

## 2022-06-04 NOTE — Telephone Encounter (Signed)
Pt is requesting we send the generic version of VYTORIN 10-40 MG tablet  because [facility] will not release the name brand RX.

## 2022-06-04 NOTE — Telephone Encounter (Signed)
Called pt, LVM.   

## 2022-06-05 ENCOUNTER — Other Ambulatory Visit: Payer: Self-pay | Admitting: Internal Medicine

## 2022-06-05 DIAGNOSIS — I10 Essential (primary) hypertension: Secondary | ICD-10-CM

## 2022-07-14 ENCOUNTER — Encounter: Payer: Self-pay | Admitting: Internal Medicine

## 2022-07-15 ENCOUNTER — Other Ambulatory Visit: Payer: Self-pay | Admitting: Internal Medicine

## 2022-08-05 ENCOUNTER — Encounter: Payer: Self-pay | Admitting: Internal Medicine

## 2022-08-05 ENCOUNTER — Ambulatory Visit (INDEPENDENT_AMBULATORY_CARE_PROVIDER_SITE_OTHER): Payer: Medicare Other | Admitting: Internal Medicine

## 2022-08-05 VITALS — BP 140/78 | HR 67 | Temp 97.7°F | Ht 72.0 in | Wt 195.0 lb

## 2022-08-05 DIAGNOSIS — E785 Hyperlipidemia, unspecified: Secondary | ICD-10-CM

## 2022-08-05 DIAGNOSIS — G720 Drug-induced myopathy: Secondary | ICD-10-CM

## 2022-08-05 DIAGNOSIS — I10 Essential (primary) hypertension: Secondary | ICD-10-CM

## 2022-08-05 DIAGNOSIS — I5032 Chronic diastolic (congestive) heart failure: Secondary | ICD-10-CM

## 2022-08-05 DIAGNOSIS — T466X5A Adverse effect of antihyperlipidemic and antiarteriosclerotic drugs, initial encounter: Secondary | ICD-10-CM | POA: Insufficient documentation

## 2022-08-05 DIAGNOSIS — M4802 Spinal stenosis, cervical region: Secondary | ICD-10-CM

## 2022-08-05 DIAGNOSIS — R590 Localized enlarged lymph nodes: Secondary | ICD-10-CM | POA: Diagnosis not present

## 2022-08-05 DIAGNOSIS — M50122 Cervical disc disorder at C5-C6 level with radiculopathy: Secondary | ICD-10-CM

## 2022-08-05 DIAGNOSIS — M542 Cervicalgia: Secondary | ICD-10-CM | POA: Insufficient documentation

## 2022-08-05 LAB — BASIC METABOLIC PANEL
BUN: 18 mg/dL (ref 6–23)
CO2: 28 mEq/L (ref 19–32)
Calcium: 9.8 mg/dL (ref 8.4–10.5)
Chloride: 102 mEq/L (ref 96–112)
Creatinine, Ser: 0.78 mg/dL (ref 0.40–1.50)
GFR: 85.95 mL/min (ref >60.00)
Glucose, Bld: 90 mg/dL (ref 70–99)
Potassium: 4.3 mEq/L (ref 3.5–5.1)
Sodium: 138 mEq/L (ref 135–145)

## 2022-08-05 LAB — CBC WITH DIFFERENTIAL/PLATELET
Basophils Absolute: 0 10*3/uL (ref 0.0–0.1)
Basophils Relative: 0.8 % (ref 0.0–3.0)
Eosinophils Absolute: 0.1 10*3/uL (ref 0.0–0.7)
Eosinophils Relative: 1.8 % (ref 0.0–5.0)
HCT: 44.5 % (ref 39.0–52.0)
Hemoglobin: 15 g/dL (ref 13.0–17.0)
Lymphocytes Relative: 27.6 % (ref 12.0–46.0)
Lymphs Abs: 1.5 10*3/uL (ref 0.7–4.0)
MCHC: 33.7 g/dL (ref 30.0–36.0)
MCV: 94.5 fl (ref 78.0–100.0)
Monocytes Absolute: 0.6 10*3/uL (ref 0.1–1.0)
Monocytes Relative: 11.2 % (ref 3.0–12.0)
Neutro Abs: 3.3 10*3/uL (ref 1.4–7.7)
Neutrophils Relative %: 58.6 % (ref 43.0–77.0)
Platelets: 230 10*3/uL (ref 150.0–400.0)
RBC: 4.7 Mil/uL (ref 4.22–5.81)
RDW: 13.4 % (ref 11.5–15.5)
WBC: 5.6 10*3/uL (ref 4.0–10.5)

## 2022-08-05 LAB — C-REACTIVE PROTEIN: CRP: <1 mg/dL (ref 0.5–20.0)

## 2022-08-05 LAB — CK: Total CK: 423 U/L — ABNORMAL HIGH (ref 7–232)

## 2022-08-05 MED ORDER — LOSARTAN POTASSIUM 50 MG PO TABS: 50.0000 mg | ORAL_TABLET | Freq: Every day | ORAL | 0 refills | Status: DC

## 2022-08-05 MED ORDER — EMPAGLIFLOZIN 10 MG PO TABS: 10.0000 mg | ORAL_TABLET | Freq: Every day | ORAL | 1 refills | Status: DC

## 2022-08-05 NOTE — Patient Instructions (Signed)
Lymphadenopathy  Lymphadenopathy means that your lymph glands are swollen or larger than normal. Lymph glands, also called lymph nodes, are collections of tissue that filter excess fluid, bacteria, viruses, and waste from your bloodstream. They are part of your body's disease-fighting system (immune system), which protects your body from germs. There may be different causes of lymphadenopathy, depending on where it is in your body. Some types go away on their own. Lymphadenopathy can occur anywhere that you have lymph glands, including these areas: Neck (cervical lymphadenopathy). Chest (mediastinal lymphadenopathy). Lungs (hilar lymphadenopathy). Underarms (axillary lymphadenopathy). Groin (inguinal lymphadenopathy). When your immune system responds to germs, infection-fighting cells and fluid build up in your lymph glands. This causes some swelling and enlargement. If the lymph nodes do not go back to normal size after you have an infection or disease, your health care provider may do tests. These tests help to monitor your condition and find the reason why the glands are still swollen and enlarged. Follow these instructions at home:  Get plenty of rest. Your health care provider may recommend over-the-counter medicines for pain. Take over-the-counter and prescription medicines only as told by your health care provider. If directed, apply heat to swollen lymph glands as often as told by your health care provider. Use the heat source that your health care provider recommends, such as a moist heat pack or a heating pad. Place a towel between your skin and the heat source. Leave the heat on for 20-30 minutes. Remove the heat if your skin turns bright red. This is especially important if you are unable to feel pain, heat, or cold. You may have a greater risk of getting burned. Check your affected lymph glands every day for changes. Check other lymph gland areas as told by your health care provider.  Check for changes such as: More swelling. Sudden increase in size. Redness or pain. Hardness. Keep all follow-up visits. This is important. Contact a health care provider if you have: Lymph glands that: Are still swollen after 2 weeks. Have suddenly gotten bigger or the swelling spreads. Are red, painful, or hard. Fluid leaking from the skin near an enlarged lymph gland. Problems with breathing. A fever, chills, or night sweats. Fatigue. A sore throat. Pain in your abdomen. Weight loss. Get help right away if you have: Severe pain. Chest pain. Shortness of breath. These symptoms may represent a serious problem that is an emergency. Do not wait to see if the symptoms will go away. Get medical help right away. Call your local emergency services (911 in the [place] Do not drive yourself to the hospital. Summary Lymphadenopathy means that your lymph glands are swollen or larger than normal. Lymph glands, also called lymph nodes, are collections of tissue that filter excess fluid, bacteria, viruses, and waste from the bloodstream. They are part of your body's disease-fighting system (immune system). Lymphadenopathy can occur anywhere that you have lymph glands. If the lymph nodes do not go back to normal size after you have an infection or disease, your health care provider may do tests to monitor your condition and find the reason why the glands are still swollen and enlarged. Check your affected lymph glands every day for changes. Check other lymph gland areas as told by your health care provider. This information is not intended to replace advice given to you by your health care provider. Make sure you discuss any questions you have with your health care provider. Document Revised: 01/05/2020 Document Reviewed: 01/05/2020 Elsevier Patient Education    2023 [employer] Inc.  

## 2022-08-05 NOTE — Progress Notes (Signed)
Subjective:  Patient ID: Jonathan Ritter, male    DOB: 1945/07/05  Age: 77 y.o. MRN: [id number]  CC: Hyperlipidemia   HPI [name] presents for f/up ---  He complains of myalgias and arthralgias and has decided to stop taking the statin.  He also complains of odynophagia located in the pharyngeal region.  He denies dysphagia but has experienced some weight loss.  Outpatient Medications Prior to Visit  Medication Sig Dispense Refill   amLODipine (NORVASC) 5 MG tablet Take 1 tablet (5 mg total) by mouth daily. 90 tablet 1   aspirin EC 81 MG tablet Take 81 mg by mouth daily.     Omega-3 Fatty Acids (FISH OIL PO) Take 1 capsule by mouth daily.     empagliflozin (JARDIANCE) 10 MG TABS tablet Take 1 tablet (10 mg total) by mouth daily. 90 tablet 1   losartan (COZAAR) 50 MG tablet TAKE 1 TABLET DAILY 90 tablet 0   rosuvastatin (CRESTOR) 20 MG tablet Take 1 tablet (20 mg total) by mouth daily. (Patient not taking: Reported on 08/05/2022) 90 tablet 1   No facility-administered medications prior to visit.    ROS Review of Systems  Constitutional:  Positive for unexpected weight change. Negative for appetite change, chills, diaphoresis and fatigue.  HENT:  Positive for trouble swallowing (odynophagia). Negative for sore throat.   Eyes: Negative.   Respiratory:  Negative for cough, chest tightness, shortness of breath and wheezing.   Cardiovascular:  Negative for chest pain, palpitations and leg swelling.  Gastrointestinal:  Negative for abdominal pain, constipation, diarrhea, nausea and vomiting.  Endocrine: Negative.   Genitourinary: Negative.  Negative for difficulty urinating.  Musculoskeletal:  Positive for arthralgias, myalgias and neck pain.  Skin: Negative.  Negative for color change and pallor.  Neurological:  Negative for dizziness and weakness.  Hematological:  Negative for adenopathy. Does not bruise/bleed easily.  Psychiatric/Behavioral: Negative.      Objective:  BP (!)  140/78 (BP Location: Left Arm, Patient Position: Sitting, Cuff Size: Large)   Pulse 67   Temp 97.7 F (36.5 C) (Oral)   Ht 6' (1.829 m)   Wt 195 lb (88.5 kg)   SpO2 97%   BMI 26.45 kg/m   BP Readings from Last 3 Encounters:  08/05/22 (!) 140/78  03/21/22 136/88  12/25/21 (!) 160/92    Wt Readings from Last 3 Encounters:  08/05/22 195 lb (88.5 kg)  03/21/22 204 lb (92.5 kg)  01/03/22 198 lb (89.8 kg)    Physical Exam Vitals reviewed.  Constitutional:      General: He is not in acute distress.    Appearance: He is not ill-appearing, toxic-appearing or diaphoretic.  HENT:     Nose: Nose normal.     Mouth/Throat:     Mouth: Mucous membranes are moist.  Eyes:     General: No scleral icterus.    Conjunctiva/sclera: Conjunctivae normal.  Neck:     Thyroid: No thyroid mass, thyromegaly or thyroid tenderness.     Vascular: No carotid bruit or JVD.  Cardiovascular:     Heart sounds: No murmur heard.    No friction rub. No gallop.  Pulmonary:     Effort: Pulmonary effort is normal.     Breath sounds: No stridor. No wheezing, rhonchi or rales.  Abdominal:     General: Abdomen is flat.     Palpations: There is no mass.     Tenderness: There is no abdominal tenderness. There is no guarding.  Hernia: No hernia is present.  Musculoskeletal:        General: Normal range of motion.     Right lower leg: No edema.     Left lower leg: No edema.  Lymphadenopathy:     Head:     Right side of head: No submental, submandibular, tonsillar, preauricular, posterior auricular or occipital adenopathy.     Left side of head: No submental, submandibular, tonsillar, preauricular, posterior auricular or occipital adenopathy.     Cervical: Cervical adenopathy present.     Right cervical: No superficial, deep or posterior cervical adenopathy.    Left cervical: Superficial cervical adenopathy present. No deep or posterior cervical adenopathy.     Upper Body:     Right upper body: No  supraclavicular or axillary adenopathy.     Left upper body: No supraclavicular or axillary adenopathy.     Comments: Left anterior cervical lymph node is enlarged.  It is nontender, nonfluctuant, and freely mobile.  Skin:    General: Skin is warm and dry.     Coloration: Skin is not pale.     Findings: No rash.  Neurological:     General: No focal deficit present.     Mental Status: He is alert. Mental status is at baseline.  Psychiatric:        Mood and Affect: Mood normal.        Behavior: Behavior normal.     Lab Results  Component Value Date   WBC 5.6 08/05/2022   HGB 15.0 08/05/2022   HCT 44.5 08/05/2022   PLT 230.0 08/05/2022   GLUCOSE 90 08/05/2022   CHOL 133 05/22/2021   TRIG 108 05/22/2021   HDL 56 05/22/2021   LDLCALC 55 05/22/2021   ALT 30 12/25/2021   AST 31 12/25/2021   NA 138 08/05/2022   K 4.3 08/05/2022   CL 102 08/05/2022   CREATININE 0.78 08/05/2022   BUN 18 08/05/2022   CO2 28 08/05/2022   TSH 3.64 12/25/2021    MYOCARDIAL PERFUSION IMAGING  Result Date: 01/03/2022   Findings are consistent with no prior ischemia. The study is intermediate risk.   No ST deviation was noted.   LV perfusion is normal.   Left ventricular function is abnormal. Global function is mildly reduced. Nuclear stress EF: 46 %. The left ventricular ejection fraction is mildly decreased (45-54%). End diastolic cavity size is mildly enlarged. End systolic cavity size is mildly enlarged.   Prior study not available for comparison. No reversible ischemia. LVEF 46% with basal to mid inferior wall hypokinesis (may be artifact d/t bowel attenuation) - recommend echo correlation. This is an intermediate risk study (d/t reduced LVEF). Clinical correlation is advised. No prior for comparison.    Assessment & Plan:   LAD (lymphadenopathy) of left cervical region- Will evaluate for a mass or malignant process. -     CBC with Differential/Platelet; Future -     C-reactive protein; Future -      CT SOFT TISSUE NECK W CONTRAST; Future  Chronic heart failure with preserved ejection fraction (HCC) -     Empagliflozin; Take 1 tablet (10 mg total) by mouth daily.  Dispense: 90 tablet; Refill: 1  Essential hypertension -     Losartan Potassium; Take 1 tablet (50 mg total) by mouth daily.  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     Follow-up: Return in about 3 months (around 11/05/2022).  Sanda Linger, MD

## 2022-09-05 ENCOUNTER — Ambulatory Visit
Admission: RE | Admit: 2022-09-05 | Discharge: 2022-09-05 | Disposition: A | Discharge status: Home / Self Care | Payer: Medicare Other | Source: Ambulatory Visit | Attending: Internal Medicine | Admitting: Internal Medicine

## 2022-09-05 DIAGNOSIS — M542 Cervicalgia: Secondary | ICD-10-CM

## 2022-09-05 DIAGNOSIS — R590 Localized enlarged lymph nodes: Secondary | ICD-10-CM

## 2022-09-05 MED ORDER — IOPAMIDOL (ISOVUE-300) INJECTION 61%
75.0000 mL | Freq: Once | INTRAVENOUS | Status: AC | PRN
  Administered 2022-09-05: 75 mL via INTRAVENOUS

## 2022-09-09 ENCOUNTER — Encounter: Payer: Self-pay | Admitting: Internal Medicine

## 2022-09-14 ENCOUNTER — Encounter: Payer: Self-pay | Admitting: Internal Medicine

## 2022-09-19 ENCOUNTER — Ambulatory Visit (INDEPENDENT_AMBULATORY_CARE_PROVIDER_SITE_OTHER): Payer: Medicare Other | Admitting: Internal Medicine

## 2022-09-19 ENCOUNTER — Encounter: Payer: Self-pay | Admitting: Internal Medicine

## 2022-09-19 VITALS — BP 122/76 | HR 68 | Temp 97.7°F | Ht 72.0 in | Wt 195.0 lb

## 2022-09-19 DIAGNOSIS — M21621 Bunionette of right foot: Secondary | ICD-10-CM | POA: Diagnosis not present

## 2022-09-19 DIAGNOSIS — M4802 Spinal stenosis, cervical region: Secondary | ICD-10-CM

## 2022-09-19 DIAGNOSIS — I10 Essential (primary) hypertension: Secondary | ICD-10-CM

## 2022-09-19 MED ORDER — LOSARTAN POTASSIUM 50 MG PO TABS: 50.0000 mg | ORAL_TABLET | Freq: Every day | ORAL | 1 refills | Status: DC

## 2022-09-19 MED ORDER — AMLODIPINE BESYLATE 5 MG PO TABS: 5.0000 mg | ORAL_TABLET | Freq: Every day | ORAL | 1 refills | Status: DC

## 2022-09-19 NOTE — Patient Instructions (Signed)
Hypertension, Adult High blood pressure (hypertension) is when the force of blood pumping through the arteries is too strong. The arteries are the blood vessels that carry blood from the heart throughout the body. Hypertension forces the heart to work harder to pump blood and may cause arteries to become narrow or stiff. Untreated or uncontrolled hypertension can lead to a heart attack, heart failure, a stroke, kidney disease, and other problems. A blood pressure reading consists of a higher number over a lower number. Ideally, your blood pressure should be below 120/80. The first ("top") number is called the systolic pressure. It is a measure of the pressure in your arteries as your heart beats. The second ("bottom") number is called the diastolic pressure. It is a measure of the pressure in your arteries as the heart relaxes. What are the causes? The exact cause of this condition is not known. There are some conditions that result in high blood pressure. What increases the risk? Certain factors may make you more likely to develop high blood pressure. Some of these risk factors are under your control, including: Smoking. Not getting enough exercise or physical activity. Being overweight. Having too much fat, sugar, calories, or salt (sodium) in your diet. Drinking too much alcohol. Other risk factors include: Having a personal history of heart disease, diabetes, high cholesterol, or kidney disease. Stress. Having a family history of high blood pressure and high cholesterol. Having obstructive sleep apnea. Age. The risk increases with age. What are the signs or symptoms? High blood pressure may not cause symptoms. Very high blood pressure (hypertensive crisis) may cause: Headache. Fast or irregular heartbeats (palpitations). Shortness of breath. Nosebleed. Nausea and vomiting. Vision changes. Severe chest pain, dizziness, and seizures. How is this diagnosed? This condition is diagnosed by  measuring your blood pressure while you are seated, with your arm resting on a flat surface, your legs uncrossed, and your feet flat on the floor. The cuff of the blood pressure monitor will be placed directly against the skin of your upper arm at the level of your heart. Blood pressure should be measured at least twice using the same arm. Certain conditions can cause a difference in blood pressure between your right and left arms. If you have a high blood pressure reading during one visit or you have normal blood pressure with other risk factors, you may be asked to: Return on a different day to have your blood pressure checked again. Monitor your blood pressure at home for 1 week or longer. If you are diagnosed with hypertension, you may have other blood or imaging tests to help your health care provider understand your overall risk for other conditions. How is this treated? This condition is treated by making healthy lifestyle changes, such as eating healthy foods, exercising more, and reducing your alcohol intake. You may be referred for counseling on a healthy diet and physical activity. Your health care provider may prescribe medicine if lifestyle changes are not enough to get your blood pressure under control and if: Your systolic blood pressure is above 130. Your diastolic blood pressure is above 80. Your personal target blood pressure may vary depending on your medical conditions, your age, and other factors. Follow these instructions at home: Eating and drinking  Eat a diet that is high in fiber and potassium, and low in sodium, added sugar, and fat. An example of this eating plan is called the DASH diet. DASH stands for Dietary Approaches to Stop Hypertension. To eat this way: Eat   plenty of fresh fruits and vegetables. Try to fill one half of your plate at each meal with fruits and vegetables. Eat whole grains, such as whole-wheat pasta, brown rice, or whole-grain bread. Fill about one  fourth of your plate with whole grains. Eat or drink low-fat dairy products, such as skim milk or low-fat yogurt. Avoid fatty cuts of meat, processed or cured meats, and poultry with skin. Fill about one fourth of your plate with lean proteins, such as fish, chicken without skin, beans, eggs, or tofu. Avoid pre-made and processed foods. These tend to be higher in sodium, added sugar, and fat. Reduce your daily sodium intake. Many people with hypertension should eat less than 1,500 mg of sodium a day. Do not drink alcohol if: Your health care provider tells you not to drink. You are pregnant, may be pregnant, or are planning to become pregnant. If you drink alcohol: Limit how much you have to: 0-1 drink a day for women. 0-2 drinks a day for men. Know how much alcohol is in your drink. In the [place] one drink equals one 12 oz bottle of beer (355 mL), one 5 oz glass of wine (148 mL), or one 1 oz glass of hard liquor (44 mL). Lifestyle  Work with your health care provider to maintain a healthy body weight or to lose weight. Ask what an ideal weight is for you. Get at least 30 minutes of exercise that causes your heart to beat faster (aerobic exercise) most days of the week. Activities may include walking, swimming, or biking. Include exercise to strengthen your muscles (resistance exercise), such as Pilates or lifting weights, as part of your weekly exercise routine. Try to do these types of exercises for 30 minutes at least 3 days a week. Do not use any products that contain nicotine or tobacco. These products include cigarettes, chewing tobacco, and vaping devices, such as e-cigarettes. If you need help quitting, ask your health care provider. Monitor your blood pressure at home as told by your health care provider. Keep all follow-up visits. This is important. Medicines Take over-the-counter and prescription medicines only as told by your health care provider. Follow directions carefully. Blood  pressure medicines must be taken as prescribed. Do not skip doses of blood pressure medicine. Doing this puts you at risk for problems and can make the medicine less effective. Ask your health care provider about side effects or reactions to medicines that you should watch for. Contact a health care provider if you: Think you are having a reaction to a medicine you are taking. Have headaches that keep coming back (recurring). Feel dizzy. Have swelling in your ankles. Have trouble with your vision. Get help right away if you: Develop a severe headache or confusion. Have unusual weakness or numbness. Feel faint. Have severe pain in your chest or abdomen. Vomit repeatedly. Have trouble breathing. These symptoms may be an emergency. Get help right away. Call 911. Do not wait to see if the symptoms will go away. Do not drive yourself to the hospital. Summary Hypertension is when the force of blood pumping through your arteries is too strong. If this condition is not controlled, it may put you at risk for serious complications. Your personal target blood pressure may vary depending on your medical conditions, your age, and other factors. For most people, a normal blood pressure is less than 120/80. Hypertension is treated with lifestyle changes, medicines, or a combination of both. Lifestyle changes include losing weight, eating a healthy,   low-sodium diet, exercising more, and limiting alcohol. This information is not intended to replace advice given to you by your health care provider. Make sure you discuss any questions you have with your health care provider. Document Revised: 01/16/2021 Document Reviewed: 01/16/2021 Elsevier Patient Education  2024 [employer]  

## 2022-09-19 NOTE — Progress Notes (Signed)
Subjective:  Patient ID: Jonathan Ritter, male    DOB: 10/01/1945  Age: 77 y.o. MRN: [id number]  CC: Hypertension   HPI Jonathan Ritter presents for f/up ----    Discussed the use of AI scribe software for clinical note transcription with the patient, who gave verbal consent to proceed.  History of Present Illness   The patient, with a history of cervical spine disease, presents with intermittent dysphagia. He reports a history of compression and impingement at the C6 and C7 levels, with associated arthritis. The patient has noticed a decrease in the swallowing difficulty after performing certain neck exercises. He denies any choking or gasping episodes.  The patient also reports weakness in his hands, particularly in the morning, to the point of needing assistance to open a water bottle. He describes stiffness in the proximal interphalangeal joint of one finger. He denies any numbness, tingling, or other neurological symptoms in the arms.  In addition to the cervical spine and hand issues, the patient reports a recent acute shoulder pain episode while closing a bus window, describing it as a stabbing sensation. He experienced a few days of immobility following the incident. The patient has a history of shoulder issues, including an inability to lift his arm and weight-bearing difficulties. He has previously received cortisone shots for the shoulder pain, which were ineffective. He occasionally takes Tylenol for pain management.       Outpatient Medications Prior to Visit  Medication Sig Dispense Refill   aspirin EC 81 MG tablet Take 81 mg by mouth daily.     empagliflozin (JARDIANCE) 10 MG TABS tablet Take 1 tablet (10 mg total) by mouth daily. 90 tablet 1   Omega-3 Fatty Acids (FISH OIL PO) Take 1 capsule by mouth daily.     amLODipine (NORVASC) 5 MG tablet Take 1 tablet (5 mg total) by mouth daily. 90 tablet 1   losartan (COZAAR) 50 MG tablet Take 1 tablet (50 mg total) by mouth daily. 90  tablet 0   No facility-administered medications prior to visit.    ROS Review of Systems  Constitutional: Negative.  Negative for diaphoresis and fatigue.  HENT:  Positive for trouble swallowing. Negative for voice change.   Eyes: Negative.   Respiratory:  Negative for cough and shortness of breath.   Cardiovascular:  Negative for chest pain, palpitations and leg swelling.  Gastrointestinal:  Negative for abdominal pain, constipation, diarrhea, nausea and vomiting.  Endocrine: Negative.   Genitourinary: Negative.   Musculoskeletal:  Positive for arthralgias and neck pain. Negative for back pain.  Skin:  Negative for color change.  Neurological: Negative.  Negative for dizziness and weakness.  Hematological:  Negative for adenopathy. Does not bruise/bleed easily.  Psychiatric/Behavioral:  Negative for self-injury.     Objective:  BP 122/76 (BP Location: Right Arm, Patient Position: Sitting, Cuff Size: Large)   Pulse 68   Temp 97.7 F (36.5 C) (Oral)   Ht 6' (1.829 m)   Wt 195 lb (88.5 kg)   SpO2 97%   BMI 26.45 kg/m   BP Readings from Last 3 Encounters:  09/19/22 122/76  08/05/22 (!) 140/78  03/21/22 136/88    Wt Readings from Last 3 Encounters:  09/19/22 195 lb (88.5 kg)  08/05/22 195 lb (88.5 kg)  03/21/22 204 lb (92.5 kg)    Physical Exam Vitals reviewed.  HENT:     Mouth/Throat:     Mouth: Mucous membranes are moist.  Eyes:     General: No scleral  icterus.    Conjunctiva/sclera: Conjunctivae normal.  Cardiovascular:     Rate and Rhythm: Normal rate and regular rhythm.     Heart sounds: No murmur heard. Pulmonary:     Effort: Pulmonary effort is normal.     Breath sounds: No stridor. No wheezing, rhonchi or rales.  Abdominal:     General: Abdomen is flat.     Palpations: There is no mass.     Tenderness: There is no abdominal tenderness. There is no guarding.     Hernia: No hernia is present.  Musculoskeletal:        General: Deformity present.  Normal range of motion.     Cervical back: Neck supple.     Right lower leg: No edema.     Left lower leg: No edema.  Lymphadenopathy:     Cervical: No cervical adenopathy.  Skin:    General: Skin is warm and dry.  Neurological:     General: No focal deficit present.     Mental Status: He is alert.  Psychiatric:        Mood and Affect: Mood normal.        Behavior: Behavior normal.     Lab Results  Component Value Date   WBC 5.6 08/05/2022   HGB 15.0 08/05/2022   HCT 44.5 08/05/2022   PLT 230.0 08/05/2022   GLUCOSE 90 08/05/2022   CHOL 133 05/22/2021   TRIG 108 05/22/2021   HDL 56 05/22/2021   LDLCALC 55 05/22/2021   ALT 30 12/25/2021   AST 31 12/25/2021   NA 138 08/05/2022   K 4.3 08/05/2022   CL 102 08/05/2022   CREATININE 0.78 08/05/2022   BUN 18 08/05/2022   CO2 28 08/05/2022   TSH 3.64 12/25/2021    CT Soft Tissue Neck W Contrast  Result Date: 09/14/2022 CLINICAL DATA:  Painful swallowing EXAM: CT NECK WITH CONTRAST TECHNIQUE: Multidetector CT imaging of the neck was performed using the standard protocol following the bolus administration of intravenous contrast. RADIATION DOSE REDUCTION: This exam was performed according to the departmental dose-optimization program which includes automated exposure control, adjustment of the mA and/or kV according to patient size and/or use of iterative reconstruction technique. CONTRAST:  75mL ISOVUE-300 IOPAMIDOL (ISOVUE-300) INJECTION 61% COMPARISON:  None Available. FINDINGS: Pharynx and larynx: Normal. No mass or swelling. Salivary glands: No inflammation, mass, or stone. Thyroid: Normal. Lymph nodes: None enlarged or abnormal density. Vascular: Negative. Limited intracranial: Negative. Visualized orbits: Minimally covered and negative Mastoids and visualized paranasal sinuses: Clear Skeleton: Generalized degenerative endplate and facet spurring with multilevel foraminal crowding. C6-7 bulky ventral osteophyte impinging on the  esophageal verge. Upper chest: Negative IMPRESSION: 1. No mass or inflammation seen in the neck. 2. C6-7 bulky ventral osteophyte impinging on the esophageal verge. Electronically Signed   By: Tiburcio Pea M.D.   On: 09/14/2022 07:29    Assessment & Plan:   Essential hypertension- His BP is well controlled. -     amLODIPine Besylate; Take 1 tablet (5 mg total) by mouth daily.  Dispense: 90 tablet; Refill: 1 -     Losartan Potassium; Take 1 tablet (50 mg total) by mouth daily.  Dispense: 90 tablet; Refill: 1  Neuroforaminal stenosis of cervical spine -     Ambulatory referral to Neurosurgery  Tailor's bunion of right foot -     Ambulatory referral to Podiatry     Follow-up: Return in about 6 months (around 03/21/2023).  Sanda Linger, MD

## 2022-10-14 ENCOUNTER — Ambulatory Visit (INDEPENDENT_AMBULATORY_CARE_PROVIDER_SITE_OTHER): Payer: Medicare Other | Admitting: Podiatry

## 2022-10-14 ENCOUNTER — Ambulatory Visit: Payer: Medicare Other

## 2022-10-14 ENCOUNTER — Ambulatory Visit (INDEPENDENT_AMBULATORY_CARE_PROVIDER_SITE_OTHER): Payer: Medicare Other

## 2022-10-14 VITALS — Ht 72.0 in | Wt 195.0 lb

## 2022-10-14 DIAGNOSIS — M778 Other enthesopathies, not elsewhere classified: Secondary | ICD-10-CM

## 2022-10-14 DIAGNOSIS — M21621 Bunionette of right foot: Secondary | ICD-10-CM | POA: Insufficient documentation

## 2022-10-14 DIAGNOSIS — Z0000 Encounter for general adult medical examination without abnormal findings: Secondary | ICD-10-CM

## 2022-10-14 DIAGNOSIS — Q66221 Congenital metatarsus adductus, right foot: Secondary | ICD-10-CM

## 2022-10-14 MED ORDER — MELOXICAM 7.5 MG PO TABS: 7.5000 mg | ORAL_TABLET | Freq: Every day | ORAL | 0 refills | Status: DC

## 2022-10-14 NOTE — Patient Instructions (Signed)
[name] , Thank you for taking time to come for your Medicare Wellness Visit. I appreciate your ongoing commitment to your health goals. Please review the following plan we discussed and let me know if I can assist you in the future.   These are the goals we discussed:  Goals   None     This is a list of the screening recommended for you and due dates:  Health Maintenance  Topic Date Due   Flu Shot  10/24/2022   Medicare Annual Wellness Visit  10/14/2023   DTaP/Tdap/Td vaccine (2 - Td or Tdap) 11/15/2026   Pneumonia Vaccine  Completed   Hepatitis C Screening  Completed   Zoster (Shingles) Vaccine  Completed   HPV Vaccine  Aged Out   COVID-19 Vaccine  Discontinued    Advanced directives: Please bring a copy of your health care power of attorney and living will to the office to be added to your chart at your convenience.   Conditions/risks identified: Each day, aim for 6 glasses of water, plenty of protein in your diet and try to get up and walk/ stretch every hour for 5-10 minutes at a time.    Next appointment: Follow up in one year for your annual wellness visit.   Preventive Care 78 Years and Older, Male  Preventive care refers to lifestyle choices and visits with your health care provider that can promote health and wellness. What does preventive care include? A yearly physical exam. This is also called an annual well check. Dental exams once or twice a year. Routine eye exams. Ask your health care provider how often you should have your eyes checked. Personal lifestyle choices, including: Daily care of your teeth and gums. Regular physical activity. Eating a healthy diet. Avoiding tobacco and drug use. Limiting alcohol use. Practicing safe sex. Taking low doses of aspirin every day. Taking vitamin and mineral supplements as recommended by your health care provider. What happens during an annual well check? The services and screenings done by your health care provider  during your annual well check will depend on your age, overall health, lifestyle risk factors, and family history of disease. Counseling  Your health care provider may ask you questions about your: Alcohol use. Tobacco use. Drug use. Emotional well-being. Home and relationship well-being. Sexual activity. Eating habits. History of falls. Memory and ability to understand (cognition). Work and work Astronomer. Screening  You may have the following tests or measurements: Height, weight, and BMI. Blood pressure. Lipid and cholesterol levels. These may be checked every 5 years, or more frequently if you are over 52 years old. Skin check. Lung cancer screening. You may have this screening every year starting at age 75 if you have a 30-pack-year history of smoking and currently smoke or have quit within the past 15 years. Fecal occult blood test (FOBT) of the stool. You may have this test every year starting at age 52. Flexible sigmoidoscopy or colonoscopy. You may have a sigmoidoscopy every 5 years or a colonoscopy every 10 years starting at age 70. Prostate cancer screening. Recommendations will vary depending on your family history and other risks. Hepatitis C blood test. Hepatitis B blood test. Sexually transmitted disease (STD) testing. Diabetes screening. This is done by checking your blood sugar (glucose) after you have not eaten for a while (fasting). You may have this done every 1-3 years. Abdominal aortic aneurysm (AAA) screening. You may need this if you are a current or former smoker. Osteoporosis. You  may be screened starting at age 35 if you are at high risk. Talk with your health care provider about your test results, treatment options, and if necessary, the need for more tests. Vaccines  Your health care provider may recommend certain vaccines, such as: Influenza vaccine. This is recommended every year. Tetanus, diphtheria, and acellular pertussis (Tdap, Td) vaccine. You  may need a Td booster every 10 years. Zoster vaccine. You may need this after age 8. Pneumococcal 13-valent conjugate (PCV13) vaccine. One dose is recommended after age 65. Pneumococcal polysaccharide (PPSV23) vaccine. One dose is recommended after age 14. Talk to your health care provider about which screenings and vaccines you need and how often you need them. This information is not intended to replace advice given to you by your health care provider. Make sure you discuss any questions you have with your health care provider. Document Released: 04/07/2015 Document Revised: 11/29/2015 Document Reviewed: 01/10/2015 Elsevier Interactive Patient Education  2017 [employer].  Fall Prevention in the Home Falls can cause injuries. They can happen to people of all ages. There are many things you can do to make your home safe and to help prevent falls. What can I do on the outside of my home? Regularly fix the edges of walkways and driveways and fix any cracks. Remove anything that might make you trip as you walk through a door, such as a raised step or threshold. Trim any bushes or trees on the path to your home. Use bright outdoor lighting. Clear any walking paths of anything that might make someone trip, such as rocks or tools. Regularly check to see if handrails are loose or broken. Make sure that both sides of any steps have handrails. Any raised decks and porches should have guardrails on the edges. Have any leaves, snow, or ice cleared regularly. Use sand or salt on walking paths during winter. Clean up any spills in your garage right away. This includes oil or grease spills. What can I do in the bathroom? Use night lights. Install grab bars by the toilet and in the tub and shower. Do not use towel bars as grab bars. Use non-skid mats or decals in the tub or shower. If you need to sit down in the shower, use a plastic, non-slip stool. Keep the floor dry. Clean up any water that spills  on the floor as soon as it happens. Remove soap buildup in the tub or shower regularly. Attach bath mats securely with double-sided non-slip rug tape. Do not have throw rugs and other things on the floor that can make you trip. What can I do in the bedroom? Use night lights. Make sure that you have a light by your bed that is easy to reach. Do not use any sheets or blankets that are too big for your bed. They should not hang down onto the floor. Have a firm chair that has side arms. You can use this for support while you get dressed. Do not have throw rugs and other things on the floor that can make you trip. What can I do in the kitchen? Clean up any spills right away. Avoid walking on wet floors. Keep items that you use a lot in easy-to-reach places. If you need to reach something above you, use a strong step stool that has a grab bar. Keep electrical cords out of the way. Do not use floor polish or wax that makes floors slippery. If you must use wax, use non-skid floor wax. Do  not have throw rugs and other things on the floor that can make you trip. What can I do with my stairs? Do not leave any items on the stairs. Make sure that there are handrails on both sides of the stairs and use them. Fix handrails that are broken or loose. Make sure that handrails are as long as the stairways. Check any carpeting to make sure that it is firmly attached to the stairs. Fix any carpet that is loose or worn. Avoid having throw rugs at the top or bottom of the stairs. If you do have throw rugs, attach them to the floor with carpet tape. Make sure that you have a light switch at the top of the stairs and the bottom of the stairs. If you do not have them, ask someone to add them for you. What else can I do to help prevent falls? Wear shoes that: Do not have high heels. Have rubber bottoms. Are comfortable and fit you well. Are closed at the toe. Do not wear sandals. If you use a stepladder: Make  sure that it is fully opened. Do not climb a closed stepladder. Make sure that both sides of the stepladder are locked into place. Ask someone to hold it for you, if possible. Clearly mark and make sure that you can see: Any grab bars or handrails. First and last steps. Where the edge of each step is. Use tools that help you move around (mobility aids) if they are needed. These include: Canes. Walkers. Scooters. Crutches. Turn on the lights when you go into a dark area. Replace any light bulbs as soon as they burn out. Set up your furniture so you have a clear path. Avoid moving your furniture around. If any of your floors are uneven, fix them. If there are any pets around you, be aware of where they are. Review your medicines with your doctor. Some medicines can make you feel dizzy. This can increase your chance of falling. Ask your doctor what other things that you can do to help prevent falls. This information is not intended to replace advice given to you by your health care provider. Make sure you discuss any questions you have with your health care provider. Document Released: 01/05/2009 Document Revised: 08/17/2015 Document Reviewed: 04/15/2014 Elsevier Interactive Patient Education  2017 [employer].

## 2022-10-14 NOTE — Progress Notes (Signed)
Subjective:   Jonathan Ritter is a 77 y.o. male who presents for an Initial Medicare Annual Wellness Visit.  Visit Complete: Virtual  I connected with  Jonathan Ritter on 10/14/22 by a audio enabled telemedicine application and verified that I am speaking with the correct person using two identifiers.  Patient Location: Home  Provider Location: Office/Clinic  I discussed the limitations of evaluation and management by telemedicine. The patient expressed understanding and agreed to proceed.  Review of Systems    Cardiac Risk Factors include: advanced age (>22men, >20 women);dyslipidemia;hypertension;male gender;Other (see comment), Risk factor comments: CHF     Objective:    Today's Vitals   10/14/22 0944  Weight: 195 lb (88.5 kg)  Height: 6' (1.829 m)   Body mass index is 26.45 kg/m.     10/14/2022    9:49 AM 02/17/2015   11:47 AM  Advanced Directives  Does Patient Have a Medical Advance Directive? Yes No  Type of Estate agent of [place];Living will   Copy of Healthcare Power of Attorney in Chart? No - copy requested   Would patient like information on creating a medical advance directive?  No - patient declined information    Current Medications (verified) Outpatient Encounter Medications as of 10/14/2022  Medication Sig   amLODipine (NORVASC) 5 MG tablet Take 1 tablet (5 mg total) by mouth daily.   aspirin EC 81 MG tablet Take 81 mg by mouth daily.   empagliflozin (JARDIANCE) 10 MG TABS tablet Take 1 tablet (10 mg total) by mouth daily.   losartan (COZAAR) 50 MG tablet Take 1 tablet (50 mg total) by mouth daily.   Multiple Vitamin (MULTIVITAMIN) capsule Take 1 capsule by mouth daily.   Omega-3 Fatty Acids (FISH OIL PO) Take 1 capsule by mouth daily.   No facility-administered encounter medications on file as of 10/14/2022.    Allergies (verified) Crestor [rosuvastatin] and Dilaudid [hydromorphone hcl]   History: Past Medical History:  Diagnosis  Date   Cancer (HCC)    prostate   High cholesterol    Hypertension    Past Surgical History:  Procedure Laterality Date   HERNIA REPAIR     PROSTATE SURGERY     REPLACEMENT TOTAL KNEE     Family History  Problem Relation Age of Onset   Alcoholism Mother    Stroke Mother    CAD Father    Alcoholism Brother    Social History   Socioeconomic History   Marital status: Married    Spouse name: Jonathan Ritter   Number of children: 2   Years of education: Not on file   Highest education level: Master's degree (e.g., MA, MS, MEng, MEd, MSW, MBA)  Occupational History   Occupation: Retired  Tobacco Use   Smoking status: Former    Types: Cigarettes   Smokeless tobacco: Not on file  Vaping Use   Vaping status: Never Used  Substance and Sexual Activity   Alcohol use: Yes    Alcohol/week: 7.0 standard drinks of alcohol    Types: 7 Glasses of wine per week    Comment: occasional   Drug use: No   Sexual activity: Not Currently    Partners: Female  Other Topics Concern   Not on file  Social History Narrative   Not on file   Social Determinants of Health   Financial Resource Strain: Low Risk  (10/14/2022)   Overall Financial Resource Strain (CARDIA)    Difficulty of Paying Living Expenses: Not very hard  Food Insecurity: No Food Insecurity (10/14/2022)   Hunger Vital Sign    Worried About Running Out of Food in the Last Year: Never true    Ran Out of Food in the Last Year: Never true  Transportation Needs: No Transportation Needs (10/14/2022)   PRAPARE - Administrator, Civil Service (Medical): No    Lack of Transportation (Non-Medical): No  Physical Activity: Insufficiently Active (10/14/2022)   Exercise Vital Sign    Days of Exercise per Week: 7 days    Minutes of Exercise per Session: 20 min  Stress: No Stress Concern Present (10/14/2022)   [employer] of Occupational Health - Occupational Stress Questionnaire    Feeling of Stress : Not at all  Social  Connections: Socially Isolated (10/14/2022)   Social Connection and Isolation Panel [NHANES]    Frequency of Communication with Friends and Family: Once a week    Frequency of Social Gatherings with Friends and Family: Never    Attends Religious Services: Never    Database administrator or Organizations: No    Attends Engineer, structural: Never    Marital Status: Married    Tobacco Counseling Counseling given: Not Answered   Clinical Intake:  Pre-visit preparation completed: Yes  Pain : No/denies pain     BMI - recorded: 26.45 Nutritional Status: BMI 25 -29 Overweight Nutritional Risks: None Diabetes: No  How often do you need to have someone help you when you read instructions, pamphlets, or other written materials from your doctor or pharmacy?: 1 - Never  Interpreter Needed?: No  Information entered by :: Tawonna Esquer, RMA   Activities of Daily Living    10/14/2022   10:03 AM  In your present state of health, do you have any difficulty performing the following activities:  Hearing? 1  Comment per patient-wears hearing aides  Vision? 0  Difficulty concentrating or making decisions? 0  Walking or climbing stairs? 0  Dressing or bathing? 0  Doing errands, shopping? 0  Preparing Food and eating ? N  Using the Toilet? N  In the past six months, have you accidently leaked urine? N  Do you have problems with loss of bowel control? N  Managing your Medications? N  Housekeeping or managing your Housekeeping? N    Patient Care Team: Etta Grandchild, MD as PCP - General (Internal Medicine)  Indicate any recent Medical Services you may have received from other than [facility] providers in the past year (date may be approximate).     Assessment:   This is a routine wellness examination for Jonathan Ritter.  Hearing/Vision screen Hearing Screening - Comments:: Wears hearing aides both ears Vision Screening - Comments:: Wears eye glasses  Dietary issues and exercise  activities discussed:     Goals Addressed   None   Depression Screen    10/14/2022    9:54 AM 12/25/2021   11:48 AM  PHQ 2/9 Scores  PHQ - 2 Score 0 0  PHQ- 9 Score 0 0    Fall Risk    10/14/2022    9:49 AM  Fall Risk   Falls in the past year? 0  Number falls in past yr: 0  Injury with Fall? 0  Risk for fall due to : No Fall Risks  Follow up Falls prevention discussed    MEDICARE RISK AT HOME:  Medicare Risk at Home - 10/14/22 0950     Any stairs in or around the home? Yes  If so, are there any without handrails? Yes    Home free of loose throw rugs in walkways, pet beds, electrical cords, etc? Yes    Adequate lighting in your home to reduce risk of falls? Yes    Life alert? No    Use of a cane, walker or w/c? No    Grab bars in the bathroom? Yes    Shower chair or bench in shower? Yes    Elevated toilet seat or a handicapped toilet? No             TIMED UP AND GO:  Was the test performed? No    Cognitive Function:        10/14/2022    9:50 AM  6CIT Screen  What Year? 0 points  What month? 0 points  What time? 0 points  Count back from 20 0 points  Months in reverse 0 points  Repeat phrase 0 points  Total Score 0 points    Immunizations Immunization History  Administered Date(s) Administered   Fluad Quad(high Dose 65+) 12/25/2021   Influenza, High Dose Seasonal PF 01/10/2018   Influenza-Unspecified 01/10/2017, 12/26/2020   Moderna Sars-Covid-2 Vaccination 05/08/2019, 06/05/2019, 01/26/2020   PNEUMOCOCCAL CONJUGATE-20 03/21/2022   Pneumococcal Conjugate-13 06/12/2017   Pneumococcal Polysaccharide-23 01/19/2019   Tdap 11/14/2016   Zoster Recombinant(Shingrix) 11/14/2016, 03/27/2017    TDAP status: Up to date  Flu Vaccine status: Up to date  Pneumococcal vaccine status: Up to date  Covid-19 vaccine status: Completed vaccines  Qualifies for Shingles Vaccine? Yes   Zostavax completed Yes   Shingrix Completed?: Yes  Screening  Tests Health Maintenance  Topic Date Due   INFLUENZA VACCINE  10/24/2022   Medicare Annual Wellness (AWV)  10/14/2023   DTaP/Tdap/Td (2 - Td or Tdap) 11/15/2026   Pneumonia Vaccine 85+ Years old  Completed   Hepatitis C Screening  Completed   Zoster Vaccines- Shingrix  Completed   HPV VACCINES  Aged Out   COVID-19 Vaccine  Discontinued    Health Maintenance  There are no preventive care reminders to display for this patient.   Colorectal cancer screening: No longer required.   Lung Cancer Screening: (Low Dose CT Chest recommended if Age 41-80 years, 20 pack-year currently smoking OR have quit w/in 15years.) does not qualify.   Lung Cancer Screening Referral: N/a  Additional Screening:  Hepatitis C Screening: does qualify; Completed 01/19/2019  Vision Screening: Recommended annual ophthalmology exams for early detection of glaucoma and other disorders of the eye. Is the patient up to date with their annual eye exam?  Yes  Who is the provider or what is the name of the office in which the patient attends annual eye exams? [facility] If pt is not established with a provider, would they like to be referred to a provider to establish care? No .   Dental Screening: Recommended annual dental exams for proper oral hygiene   Community Resource Referral / Chronic Care Management: CRR required this visit?  No   CCM required this visit?  No    Plan:     I have personally reviewed and noted the following in the patient's chart:   Medical and social history Use of alcohol, tobacco or illicit drugs  Current medications and supplements including opioid prescriptions. Patient is not currently taking opioid prescriptions. Functional ability and status Nutritional status Physical activity Advanced directives List of other physicians Hospitalizations, surgeries, and ER visits in previous 12 months Vitals Screenings to include cognitive, depression,  and falls Referrals and  appointments  In addition, I have reviewed and discussed with patient certain preventive protocols, quality metrics, and best practice recommendations. A written personalized care plan for preventive services as well as general preventive health recommendations were provided to patient.     Wh[place]any Parish L Evy Lutterman, CMA   10/14/2022   After Visit Summary: (MyChart) Due to this being a telephonic visit, the after visit summary with patients personalized plan was offered to patient via MyChart   Nurse Notes: Patient is up to date with annual eye exam.  Done on 07/23/22.  He has no concerns today.

## 2022-10-14 NOTE — Progress Notes (Signed)
Chief Complaint  Patient presents with   Bunions    Pt huas a bunion on right foot, but he states that the pain starts on the arch and moves up to the ankle hurts anytime he steps wrong "like when I walk on my gravel and it is not leveled right on that area it hurts really bad"   HPI: 77 y.o. male presents today with concern of pain near the dorsal lateral aspect of the right midfoot.  Denies trauma.  He denies any plantar foot pain.  Notes that starts in the midfoot and extends back towards the lateral ankle.  Uneven terrain aggravate the pain.  He does have orthotics from 2020.  Past Medical History:  Diagnosis Date   Cancer (HCC)    prostate   High cholesterol    Hypertension    Past Surgical History:  Procedure Laterality Date   HERNIA REPAIR     PROSTATE SURGERY     REPLACEMENT TOTAL KNEE     Allergies  Allergen Reactions   Crestor [Rosuvastatin] Other (See Comments)    Myalgia    Dilaudid [Hydromorphone Hcl] Other (See Comments)    hypotension    Physical Exam: There were no vitals filed for this visit.  There are palpable pedal pulses to the right foot.  There is localized edema near the dorsal lateral aspect of the right midfoot near the cuboid and fourth metatarsal-cuboid joint.  There is some edema along the peroneal tendons with minimal pain on palpation of the tendon sheath.  No gaps or nodules are noted within the peroneus brevis tendon.  The pain is aggravated with resisted eversion of the right foot.  Patient has a C-shaped foot type on the right foot consistent with metatarsus adductus deformity.  There is a bony prominence on the lateral aspect of the fifth metatarsal base as well as the fifth metatarsal head.  The latter is consistent with tailor's bunion deformity.  The prominent fifth metatarsal base is secondary to to the met adductus with the tarsometatarsal joint being the apex of the deformity.  Manual muscle testing 5/5.  There is a right submet 5 callus  which the patient has recently shaved so there is no excessive buildup today.  Radiographic Exam (right foot, 3 weightbearing views, 10/14/2022):  Normal osseous mineralization.  Joint space narrowing seen at the talonavicular joint with dorsal spurring of the navicular at the talonavicular joint.  There is increase in fourth intermetatarsal angle with bony prominence/enlargement on the lateral aspect of the fifth metatarsal head.  There is medial angulation of the fifth toe at the level of the MPJ.  There is slight dorsal spurring at the second metatarsal-intermediate cuneiform joint.  There are inferior and posterior calcaneal spurs present  Assessment/Plan of Care: 1. Metatarsus adductus of right foot   2. Tailor's bunion of right foot   3. Capsulitis of right foot     Meds ordered this encounter  Medications   meloxicam (MOBIC) 7.5 MG tablet    Sig: Take 1 tablet (7.5 mg total) by mouth daily.    Dispense:  15 tablet    Refill:  0   DG FOOT COMPLETE RIGHT  Discussed clinical and radiographic findings with patient and his wife today.  Agreed that patient is in need for new custom orthotics.  He will need an accommodation made for the styloid process on the right fifth metatarsal base as well as a tailor's bunion on the right fifth metatarsal head.  He  needs a full width orthotic with accommodations made in the shell for these bony prominences.  A temporary felt pad was placed along the lateral column of his current orthotic to decrease the outward pressure on the right foot.  Will get him scheduled with our new pedorthist in August for an orthotic consult.  He was instructed to bring 1 or 2 pairs of shoes so that she can see his wear pattern which will help in determining the best type of posting and accommodations to make with his new orthotics.  Will place the patient on meloxicam 7.5 mg daily for 2 weeks to help with the pain and inflammation in the area.  Patient may massage urea 40  cream into the callus at bedtime to cut down on the callus buildup.  They can purchase this off [employer].  Today's visit was for a progressively worsening issue which involved ordering and interpretation of tests (x-rays) and with moderate risk and new medications prescribed today.   Clerance Lav, DPM, FACFAS [facility]     [address] [place], [place] [address]                Office [phone]  Fax [phone]

## 2022-10-15 ENCOUNTER — Other Ambulatory Visit: Payer: Self-pay | Admitting: Podiatry

## 2022-10-15 DIAGNOSIS — M21621 Bunionette of right foot: Secondary | ICD-10-CM

## 2022-10-15 DIAGNOSIS — Q66221 Congenital metatarsus adductus, right foot: Secondary | ICD-10-CM

## 2022-10-15 DIAGNOSIS — M778 Other enthesopathies, not elsewhere classified: Secondary | ICD-10-CM

## 2022-11-04 ENCOUNTER — Other Ambulatory Visit: Payer: Self-pay | Admitting: Orthopedic Surgery

## 2022-11-04 DIAGNOSIS — G8929 Other chronic pain: Secondary | ICD-10-CM

## 2022-11-27 ENCOUNTER — Telehealth: Payer: Self-pay | Admitting: Internal Medicine

## 2022-11-27 NOTE — Telephone Encounter (Signed)
Patient is having a right hip replacement done by Dr. Turner Daniels at [facility]. They are faxing over a surgical authorization form for Dr. Yetta Barre to fill out. Patient was told to ask if he needs to come in for an appointment for it to be filled out. He wasn't sure since he was seen 09/19/22. Best callback is [phone].

## 2022-11-28 ENCOUNTER — Ambulatory Visit (INDEPENDENT_AMBULATORY_CARE_PROVIDER_SITE_OTHER): Payer: Medicare Other

## 2022-11-28 DIAGNOSIS — Q66221 Congenital metatarsus adductus, right foot: Secondary | ICD-10-CM

## 2022-11-28 DIAGNOSIS — M778 Other enthesopathies, not elsewhere classified: Secondary | ICD-10-CM | POA: Diagnosis not present

## 2022-11-28 DIAGNOSIS — M21621 Bunionette of right foot: Secondary | ICD-10-CM

## 2022-11-28 NOTE — Progress Notes (Signed)
.  Orthotic eval   Patient was seen, measured for custom molded foot orthotics patient has Tailor bunion BIL Right greater and has more pain over Left. Foot is C-shaped causing Lateral foot pain  Patient has older pair of orthotics that were made in [place] / 2020 UCBL style with FF extension. Patient was real happy with these orthotics and will try to duplicate as much as I can  Patient will benefit from CFO's as they will help provide total contact to MLA's helping to better distribute body weight across BIL feet greater reducing plantar pressure and pain and to also encourage FF and RF alignment  Patient was scanned items to be ordered and fit when in   [employer], CFo, CFm

## 2022-11-29 ENCOUNTER — Encounter: Payer: Self-pay | Admitting: Internal Medicine

## 2022-12-01 ENCOUNTER — Ambulatory Visit
Admission: RE | Admit: 2022-12-01 | Discharge: 2022-12-01 | Disposition: A | Discharge status: Home / Self Care | Payer: Medicare Other | Source: Ambulatory Visit | Attending: Orthopedic Surgery | Admitting: Orthopedic Surgery

## 2022-12-01 DIAGNOSIS — G8929 Other chronic pain: Secondary | ICD-10-CM

## 2022-12-02 NOTE — Telephone Encounter (Signed)
Surgical clearance paperwork received via fax from [facility] & placed in provider's box up front.

## 2022-12-05 NOTE — Telephone Encounter (Signed)
Form has been faxed back.

## 2022-12-12 ENCOUNTER — Encounter: Payer: Self-pay | Admitting: Internal Medicine

## 2022-12-12 ENCOUNTER — Ambulatory Visit (INDEPENDENT_AMBULATORY_CARE_PROVIDER_SITE_OTHER): Payer: Medicare Other | Admitting: Internal Medicine

## 2022-12-12 VITALS — BP 132/82 | HR 74 | Temp 98.0°F | Resp 16 | Ht 72.0 in | Wt 190.6 lb

## 2022-12-12 DIAGNOSIS — I10 Essential (primary) hypertension: Secondary | ICD-10-CM

## 2022-12-12 DIAGNOSIS — Z23 Encounter for immunization: Secondary | ICD-10-CM | POA: Diagnosis not present

## 2022-12-12 DIAGNOSIS — R9431 Abnormal electrocardiogram [ECG] [EKG]: Secondary | ICD-10-CM | POA: Diagnosis not present

## 2022-12-12 DIAGNOSIS — I5032 Chronic diastolic (congestive) heart failure: Secondary | ICD-10-CM

## 2022-12-12 DIAGNOSIS — E785 Hyperlipidemia, unspecified: Secondary | ICD-10-CM

## 2022-12-12 LAB — BASIC METABOLIC PANEL
BUN: 20 mg/dL (ref 6–23)
CO2: 28 mEq/L (ref 19–32)
Calcium: 9.9 mg/dL (ref 8.4–10.5)
Chloride: 103 mEq/L (ref 96–112)
Creatinine, Ser: 0.83 mg/dL (ref 0.40–1.50)
GFR: 84.15 mL/min (ref >60.00)
Glucose, Bld: 91 mg/dL (ref 70–99)
Potassium: 4.2 mEq/L (ref 3.5–5.1)
Sodium: 140 mEq/L (ref 135–145)

## 2022-12-12 LAB — TSH: TSH: 3.25 u[IU]/mL (ref 0.35–5.50)

## 2022-12-12 LAB — LIPID PANEL
Cholesterol: 229 mg/dL — ABNORMAL HIGH (ref 0–200)
HDL: 47.9 mg/dL (ref >39.00)
LDL Cholesterol: 127 mg/dL — ABNORMAL HIGH (ref 0–99)
NonHDL: 181.4
Total CHOL/HDL Ratio: 5
Triglycerides: 274 mg/dL — ABNORMAL HIGH (ref 0.0–149.0)
VLDL: 54.8 mg/dL — ABNORMAL HIGH (ref 0.0–40.0)

## 2022-12-12 MED ORDER — PITAVASTATIN CALCIUM 1 MG PO TABS: 1.0000 mg | ORAL_TABLET | Freq: Every day | ORAL | 1 refills | Status: DC

## 2022-12-12 NOTE — Patient Instructions (Signed)
Hypertension, Adult High blood pressure (hypertension) is when the force of blood pumping through the arteries is too strong. The arteries are the blood vessels that carry blood from the heart throughout the body. Hypertension forces the heart to work harder to pump blood and may cause arteries to become narrow or stiff. Untreated or uncontrolled hypertension can lead to a heart attack, heart failure, a stroke, kidney disease, and other problems. A blood pressure reading consists of a higher number over a lower number. Ideally, your blood pressure should be below 120/80. The first ("top") number is called the systolic pressure. It is a measure of the pressure in your arteries as your heart beats. The second ("bottom") number is called the diastolic pressure. It is a measure of the pressure in your arteries as the heart relaxes. What are the causes? The exact cause of this condition is not known. There are some conditions that result in high blood pressure. What increases the risk? Certain factors may make you more likely to develop high blood pressure. Some of these risk factors are under your control, including: Smoking. Not getting enough exercise or physical activity. Being overweight. Having too much fat, sugar, calories, or salt (sodium) in your diet. Drinking too much alcohol. Other risk factors include: Having a personal history of heart disease, diabetes, high cholesterol, or kidney disease. Stress. Having a family history of high blood pressure and high cholesterol. Having obstructive sleep apnea. Age. The risk increases with age. What are the signs or symptoms? High blood pressure may not cause symptoms. Very high blood pressure (hypertensive crisis) may cause: Headache. Fast or irregular heartbeats (palpitations). Shortness of breath. Nosebleed. Nausea and vomiting. Vision changes. Severe chest pain, dizziness, and seizures. How is this diagnosed? This condition is diagnosed by  measuring your blood pressure while you are seated, with your arm resting on a flat surface, your legs uncrossed, and your feet flat on the floor. The cuff of the blood pressure monitor will be placed directly against the skin of your upper arm at the level of your heart. Blood pressure should be measured at least twice using the same arm. Certain conditions can cause a difference in blood pressure between your right and left arms. If you have a high blood pressure reading during one visit or you have normal blood pressure with other risk factors, you may be asked to: Return on a different day to have your blood pressure checked again. Monitor your blood pressure at home for 1 week or longer. If you are diagnosed with hypertension, you may have other blood or imaging tests to help your health care provider understand your overall risk for other conditions. How is this treated? This condition is treated by making healthy lifestyle changes, such as eating healthy foods, exercising more, and reducing your alcohol intake. You may be referred for counseling on a healthy diet and physical activity. Your health care provider may prescribe medicine if lifestyle changes are not enough to get your blood pressure under control and if: Your systolic blood pressure is above 130. Your diastolic blood pressure is above 80. Your personal target blood pressure may vary depending on your medical conditions, your age, and other factors. Follow these instructions at home: Eating and drinking  Eat a diet that is high in fiber and potassium, and low in sodium, added sugar, and fat. An example of this eating plan is called the DASH diet. DASH stands for Dietary Approaches to Stop Hypertension. To eat this way: Eat   plenty of fresh fruits and vegetables. Try to fill one half of your plate at each meal with fruits and vegetables. Eat whole grains, such as whole-wheat pasta, brown rice, or whole-grain bread. Fill about one  fourth of your plate with whole grains. Eat or drink low-fat dairy products, such as skim milk or low-fat yogurt. Avoid fatty cuts of meat, processed or cured meats, and poultry with skin. Fill about one fourth of your plate with lean proteins, such as fish, chicken without skin, beans, eggs, or tofu. Avoid pre-made and processed foods. These tend to be higher in sodium, added sugar, and fat. Reduce your daily sodium intake. Many people with hypertension should eat less than 1,500 mg of sodium a day. Do not drink alcohol if: Your health care provider tells you not to drink. You are pregnant, may be pregnant, or are planning to become pregnant. If you drink alcohol: Limit how much you have to: 0-1 drink a day for women. 0-2 drinks a day for men. Know how much alcohol is in your drink. In the [place] one drink equals one 12 oz bottle of beer (355 mL), one 5 oz glass of wine (148 mL), or one 1 oz glass of hard liquor (44 mL). Lifestyle  Work with your health care provider to maintain a healthy body weight or to lose weight. Ask what an ideal weight is for you. Get at least 30 minutes of exercise that causes your heart to beat faster (aerobic exercise) most days of the week. Activities may include walking, swimming, or biking. Include exercise to strengthen your muscles (resistance exercise), such as Pilates or lifting weights, as part of your weekly exercise routine. Try to do these types of exercises for 30 minutes at least 3 days a week. Do not use any products that contain nicotine or tobacco. These products include cigarettes, chewing tobacco, and vaping devices, such as e-cigarettes. If you need help quitting, ask your health care provider. Monitor your blood pressure at home as told by your health care provider. Keep all follow-up visits. This is important. Medicines Take over-the-counter and prescription medicines only as told by your health care provider. Follow directions carefully. Blood  pressure medicines must be taken as prescribed. Do not skip doses of blood pressure medicine. Doing this puts you at risk for problems and can make the medicine less effective. Ask your health care provider about side effects or reactions to medicines that you should watch for. Contact a health care provider if you: Think you are having a reaction to a medicine you are taking. Have headaches that keep coming back (recurring). Feel dizzy. Have swelling in your ankles. Have trouble with your vision. Get help right away if you: Develop a severe headache or confusion. Have unusual weakness or numbness. Feel faint. Have severe pain in your chest or abdomen. Vomit repeatedly. Have trouble breathing. These symptoms may be an emergency. Get help right away. Call 911. Do not wait to see if the symptoms will go away. Do not drive yourself to the hospital. Summary Hypertension is when the force of blood pumping through your arteries is too strong. If this condition is not controlled, it may put you at risk for serious complications. Your personal target blood pressure may vary depending on your medical conditions, your age, and other factors. For most people, a normal blood pressure is less than 120/80. Hypertension is treated with lifestyle changes, medicines, or a combination of both. Lifestyle changes include losing weight, eating a healthy,   low-sodium diet, exercising more, and limiting alcohol. This information is not intended to replace advice given to you by your health care provider. Make sure you discuss any questions you have with your health care provider. Document Revised: 01/16/2021 Document Reviewed: 01/16/2021 Elsevier Patient Education  2024 [employer]  

## 2022-12-12 NOTE — Progress Notes (Signed)
Subjective:  Patient ID: Jonathan Ritter, male    DOB: 09/11/1945  Age: 77 y.o. MRN: [id number]  CC: Hypertension and Hyperlipidemia   HPI Jonathan Ritter presents for f/up ----  Discussed the use of AI scribe software for clinical note transcription with the patient, who gave verbal consent to proceed.  History of Present Illness   The patient, with a history of cardiovascular disease and musculoskeletal issues, presents for preoperative clearance. They report no symptoms of heart failure, chest pain, shortness of breath, dizziness, or lightheadedness. They are currently on amlodipine and losartan for blood pressure control, with no reported side effects. They deny significant lower extremity edema, but note occasional sock marks after wearing high socks.  The patient is not taking meloxicam for pain, but instead uses ibuprofen. They report sudden onset of difficulty walking a quarter of a mile, despite no history of a fall or injury. The shoulder issue does not cause daily discomfort, but the hip issue has significantly impacted their daily life and mobility.  They also mention a past issue with difficulty swallowing, which was attributed to a bone spur on C6 pressing on the esophagus. However, a specialist suggested the issue might be sinus-related. The swallowing issue has since improved and is not currently a significant concern.       Outpatient Medications Prior to Visit  Medication Sig Dispense Refill   amLODipine (NORVASC) 5 MG tablet Take 1 tablet (5 mg total) by mouth daily. 90 tablet 1   aspirin EC 81 MG tablet Take 81 mg by mouth daily.     empagliflozin (JARDIANCE) 10 MG TABS tablet Take 1 tablet (10 mg total) by mouth daily. 90 tablet 1   losartan (COZAAR) 50 MG tablet Take 1 tablet (50 mg total) by mouth daily. 90 tablet 1   Multiple Vitamin (MULTIVITAMIN) capsule Take 1 capsule by mouth daily.     Omega-3 Fatty Acids (FISH OIL PO) Take 1 capsule by mouth daily.     meloxicam  (MOBIC) 7.5 MG tablet Take 1 tablet (7.5 mg total) by mouth daily. 15 tablet 0   No facility-administered medications prior to visit.    ROS Review of Systems  Constitutional:  Negative for chills, diaphoresis, fatigue and fever.  HENT: Negative.    Eyes: Negative.   Respiratory:  Negative for cough, chest tightness, shortness of breath and wheezing.   Cardiovascular:  Negative for chest pain, palpitations and leg swelling.  Gastrointestinal:  Negative for abdominal pain, constipation, diarrhea, nausea and vomiting.  Genitourinary:  Negative for difficulty urinating.  Musculoskeletal: Negative.  Negative for arthralgias and myalgias.  Skin: Negative.   Neurological: Negative.  Negative for dizziness and weakness.  Hematological:  Negative for adenopathy. Does not bruise/bleed easily.  Psychiatric/Behavioral: Negative.      Objective:  BP 132/82 (BP Location: Left Arm, Patient Position: Sitting, Cuff Size: Normal)   Pulse 74   Temp 98 F (36.7 C) (Oral)   Resp 16   Ht 6' (1.829 m)   Wt 190 lb 9.6 oz (86.5 kg)   SpO2 98%   BMI 25.85 kg/m   BP Readings from Last 3 Encounters:  12/12/22 132/82  09/19/22 122/76  08/05/22 (!) 140/78    Wt Readings from Last 3 Encounters:  12/12/22 190 lb 9.6 oz (86.5 kg)  10/14/22 195 lb (88.5 kg)  09/19/22 195 lb (88.5 kg)    Physical Exam Vitals reviewed.  Constitutional:      Appearance: Normal appearance.  HENT:  Mouth/Throat:     Mouth: Mucous membranes are moist.  Eyes:     General: No scleral icterus.    Conjunctiva/sclera: Conjunctivae normal.  Cardiovascular:     Rate and Rhythm: Normal rate and regular rhythm.     Heart sounds: Normal heart sounds, S1 normal and S2 normal.     No friction rub. No gallop.     Comments: EKG- NSR, 74 bpm LAD Anterior infarct pattern is old No LVH Unchanged Pulmonary:     Breath sounds: No stridor. No wheezing, rhonchi or rales.  Abdominal:     General: Abdomen is flat.      Palpations: There is no mass.     Tenderness: There is no abdominal tenderness. There is no guarding.     Hernia: No hernia is present.  Musculoskeletal:        General: Normal range of motion.     Cervical back: Neck supple.     Right lower leg: No edema.     Left lower leg: No edema.  Skin:    General: Skin is warm and dry.  Neurological:     General: No focal deficit present.     Mental Status: He is alert. Mental status is at baseline.  Psychiatric:        Mood and Affect: Mood normal.        Behavior: Behavior normal.     Lab Results  Component Value Date   WBC 5.6 08/05/2022   HGB 15.0 08/05/2022   HCT 44.5 08/05/2022   PLT 230.0 08/05/2022   GLUCOSE 91 12/12/2022   CHOL 229 (H) 12/12/2022   TRIG 274.0 (H) 12/12/2022   HDL 47.90 12/12/2022   LDLCALC 127 (H) 12/12/2022   ALT 30 12/25/2021   AST 31 12/25/2021   NA 140 12/12/2022   K 4.2 12/12/2022   CL 103 12/12/2022   CREATININE 0.83 12/12/2022   BUN 20 12/12/2022   CO2 28 12/12/2022   TSH 3.25 12/12/2022    MR SHOULDER RIGHT WO CONTRAST  Result Date: 12/08/2022 CLINICAL DATA:  Right shoulder pain for 6 months. EXAM: MRI OF THE RIGHT SHOULDER WITHOUT CONTRAST TECHNIQUE: Multiplanar, multisequence MR imaging of the shoulder was performed. No intravenous contrast was administered. COMPARISON:  None Available. FINDINGS: Rotator cuff: Complete tear of the supraspinatus tendon with 3.4 cm of retraction. Severe tendinosis of the infraspinatus tendon. Teres minor tendon is intact. Moderate tendinosis of the subscapularis tendon. Muscles: No muscle atrophy or edema. No intramuscular fluid collection or hematoma. Biceps Long Head: Intraarticular portion of the long head of the biceps tendon is not well visualized concerning for a high-grade partial-thickness tear. Acromioclavicular Joint: Moderate arthropathy of the acromioclavicular joint. Small amount of subacromial/subdeltoid bursal fluid. Glenohumeral Joint: Small joint  effusion. Partial-thickness cartilage loss of the glenohumeral joint with areas of high-grade partial-thickness cartilage loss involving the posterosuperior humeral head. Labrum: Severe superior labral degeneration. Bones: No fracture or dislocation. No aggressive osseous lesion. Other: No fluid collection or hematoma. IMPRESSION: 1. Complete tear of the supraspinatus tendon with 3.4 cm of retraction. 2. Severe tendinosis of the infraspinatus tendon. 3. Moderate tendinosis of the subscapularis tendon. 4. Intraarticular portion of the long head of the biceps tendon is not well visualized concerning for a high-grade partial-thickness tear. 5. Moderate osteoarthritis of the glenohumeral joint. Electronically Signed   By: Elige Ko M.D.   On: 12/08/2022 07:25    Assessment & Plan:   Hyperlipidemia with target LDL less than 130 - Will  start a statin for CV risk reduction. -     TSH; Future -     Lipid panel; Future -     Simvastatin; Take 1 tablet (10 mg total) by mouth at bedtime.  Dispense: 90 tablet; Refill: 1  Essential hypertension- EKG is negative for LVH.  Blood pressure is well-controlled. -     TSH; Future -     Basic metabolic panel; Future  Chronic heart failure with preserved ejection fraction (HCC)- He has a normal volume status.  Abnormal electrocardiogram (ECG) (EKG)- EKG is unchanged.  Flu vaccine need -     Flu Vaccine Trivalent High Dose (Fluad)     Follow-up: Return in about 6 months (around 06/11/2023).  Sanda Linger, MD

## 2022-12-13 MED ORDER — SIMVASTATIN 10 MG PO TABS: 10.0000 mg | ORAL_TABLET | Freq: Every day | ORAL | 1 refills | Status: DC

## 2022-12-13 NOTE — Addendum Note (Signed)
Addended by: Darryll Capers on: 12/13/2022 02:05 PM   Modules accepted: Orders

## 2022-12-18 ENCOUNTER — Encounter: Payer: Self-pay | Admitting: Internal Medicine

## 2022-12-18 NOTE — Telephone Encounter (Signed)
Patient was not seen until the 19th so it could not have been faxed back on the 12th.  Please fax the surgical clearance form and any notes from the 19th to [facility].  Fax number:  [phone] or [phone]

## 2022-12-19 ENCOUNTER — Ambulatory Visit: Payer: Medicare Other | Admitting: Internal Medicine

## 2022-12-24 ENCOUNTER — Telehealth: Payer: Self-pay | Admitting: Internal Medicine

## 2022-12-24 NOTE — Telephone Encounter (Signed)
[facility] called stating they never received the surgical clearance form. I verified the fax number. Lurena Joiner ask if we could please resend the fax with medical notes and lab results. Please advise.   Best call back number is [phone]

## 2022-12-24 NOTE — Telephone Encounter (Signed)
[facility] called stating they never received the surgical clearance form. I verified the fax number. Jonathan Ritter ask if we could please resend the fax with medical notes and lab results. Please advise.   Best call back number is [phone]

## 2022-12-26 NOTE — Telephone Encounter (Signed)
clearance has been faxed again.

## 2022-12-27 ENCOUNTER — Telehealth: Payer: Self-pay | Admitting: Podiatry

## 2022-12-27 NOTE — Telephone Encounter (Signed)
Pts wife called checking on status of orthtoics. I did not see a note stating they were in but did go ahead and schedule an appt

## 2022-12-31 NOTE — Telephone Encounter (Signed)
[facility] called stating they never received the surgical clearance form. I verified the fax number. Lurena Joiner ask if we could please resend the fax with medical notes and lab results. Please advise.   Best call back number is [phone]       They still stating they haven't receive it I have 2 fax numbers below:  Fax- [phone] Fax-  [phone]

## 2023-01-07 ENCOUNTER — Other Ambulatory Visit: Payer: Medicare Other

## 2023-01-10 DIAGNOSIS — H9113 Presbycusis, bilateral: Secondary | ICD-10-CM | POA: Insufficient documentation

## 2023-01-10 DIAGNOSIS — H6123 Impacted cerumen, bilateral: Secondary | ICD-10-CM | POA: Insufficient documentation

## 2023-01-19 IMAGING — MR MR CERVICAL SPINE W/O CM
4 of 5 series · 27 of 48 positions shown · non-contrast
Comparison: None.

CLINICAL DATA: Neck pain and bilateral arm numbness.

EXAM:
MRI CERVICAL SPINE WITHOUT CONTRAST
TECHNIQUE: Multiplanar, multisequence MR imaging of the cervical spine was
performed. No intravenous contrast was administered.

[Series 5: T2 · sagittal · 3.0mm · 0.55mm/px · 7 of 15 slices shown (1 of 2)]
[im 1/15]
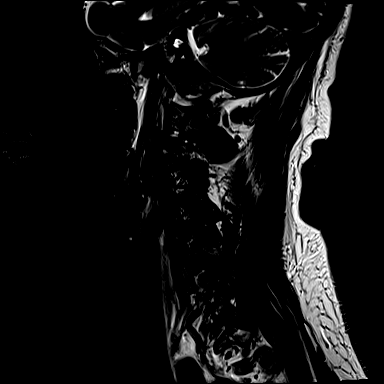
[im 3/15]
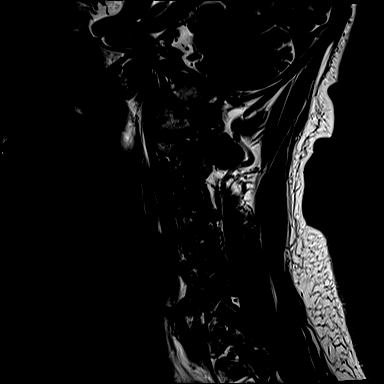
[im 5/15]
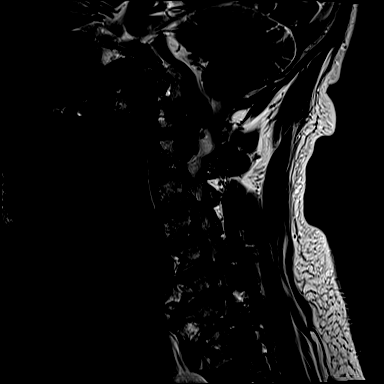
[im 8/15]
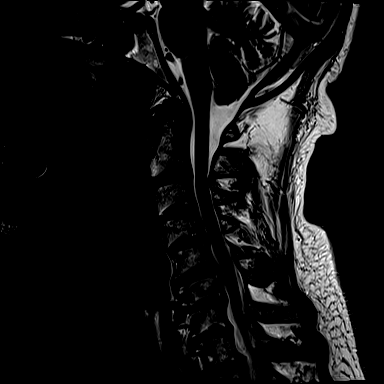
[im 10/15]
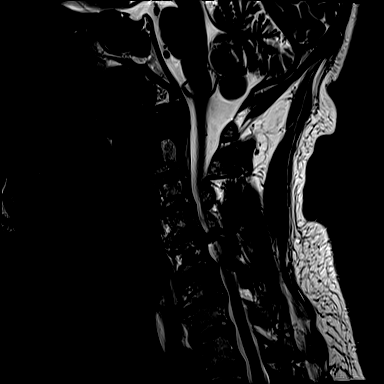
[im 12/15]
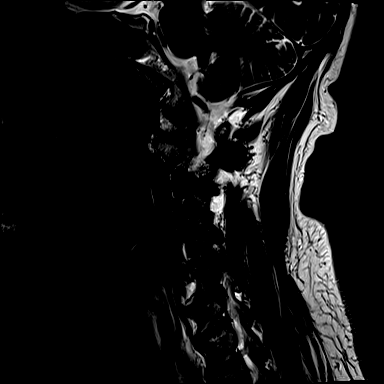
[im 15/15]
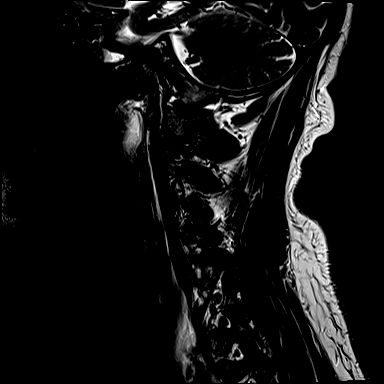

[Series 6: T1 · sagittal · 3.0mm · 0.66mm/px · 7 of 15 slices shown]
[im 1/15]
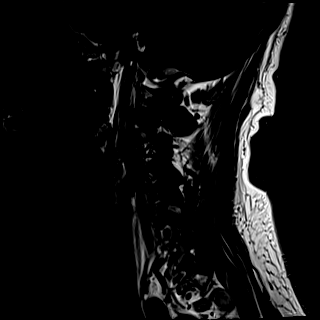
[im 3/15]
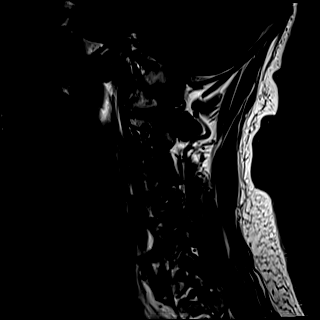
[im 5/15]
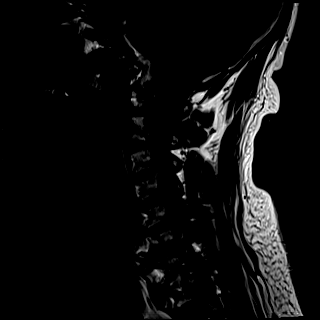
[im 8/15]
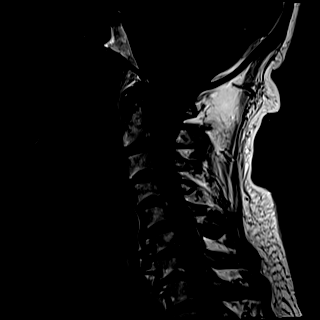
[im 10/15]
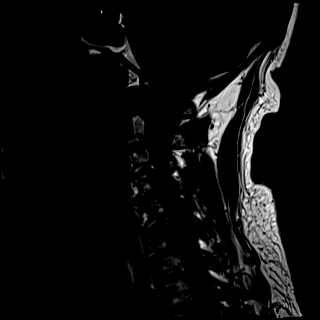
[im 12/15]
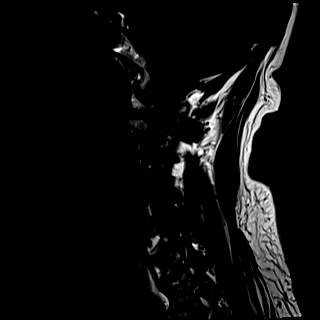
[im 15/15]
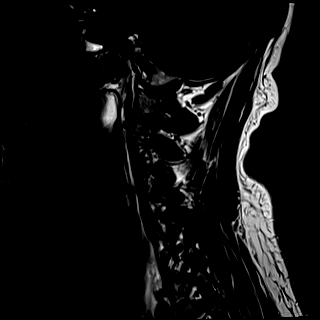

[Series 7: STIR · sagittal · 3.0mm · 0.33mm/px · 5 of 15 slices shown]
[im 1/15]
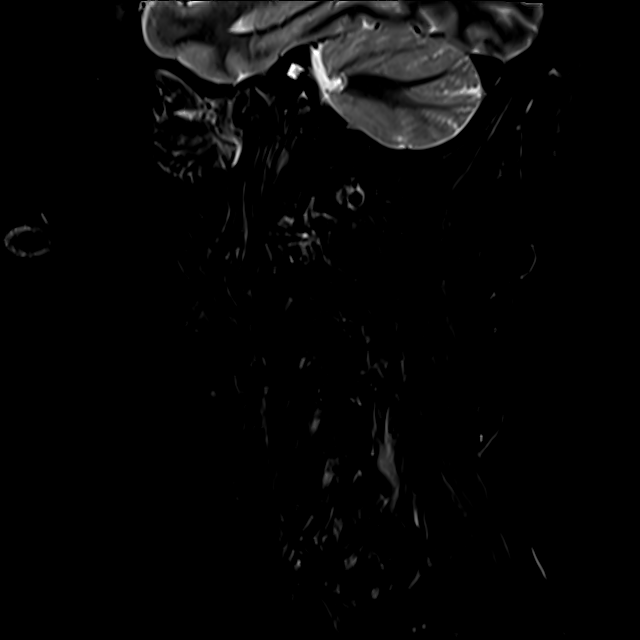
[im 3/15]
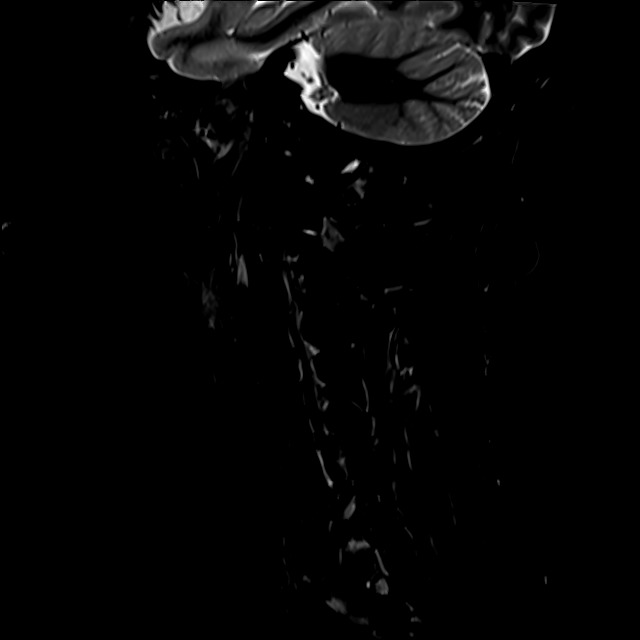
[im 6/15]
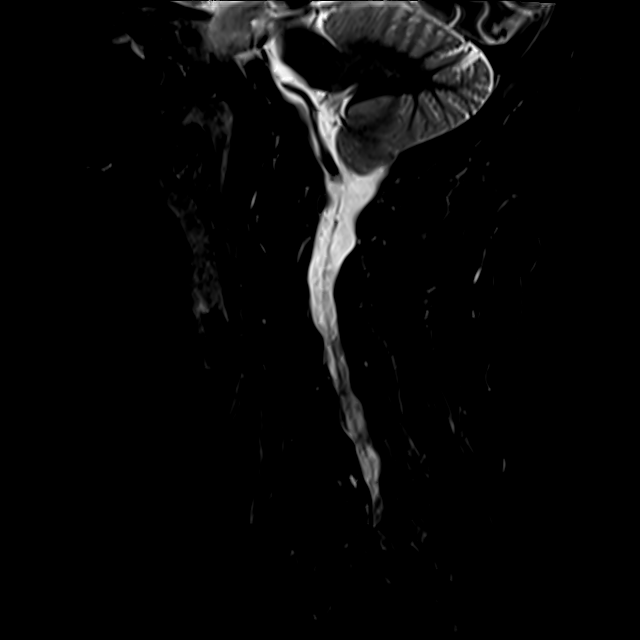
[im 9/15]
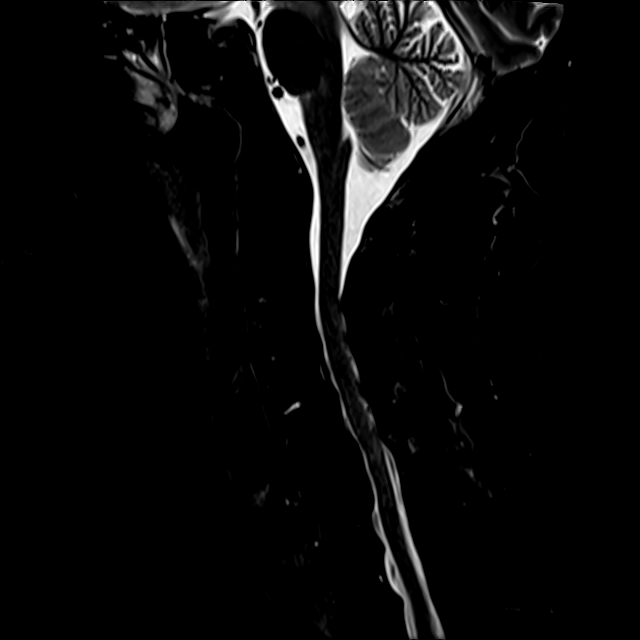
[im 15/15]
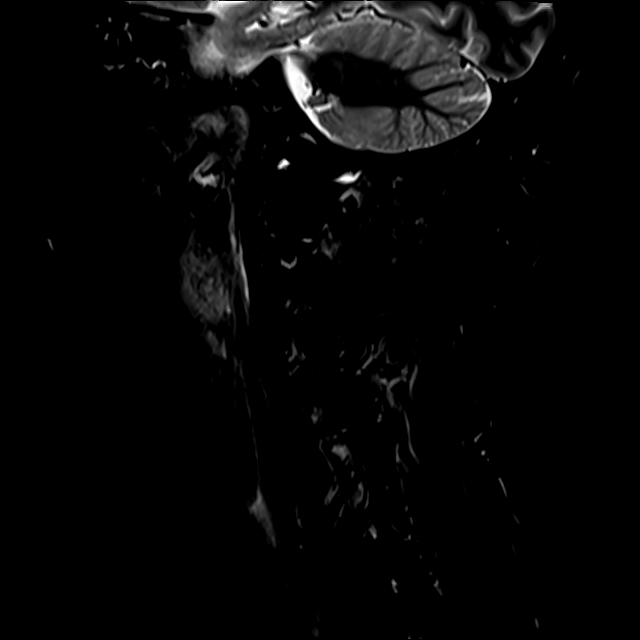

[Series 8: T2 · axial · 3.0mm · 0.50mm/px · z∈[-71,+35]mm · 8 of 34 slices shown (2 of 2)]
[im 1/34]
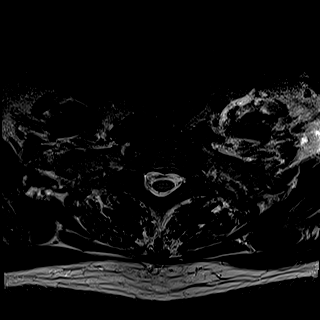
[im 6/34]
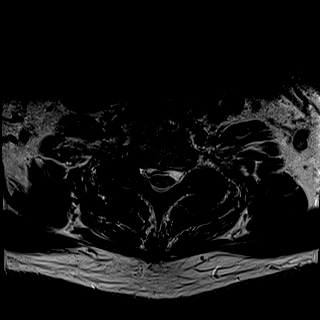
[im 11/34]
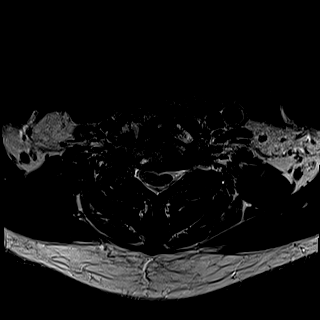
[im 16/34]
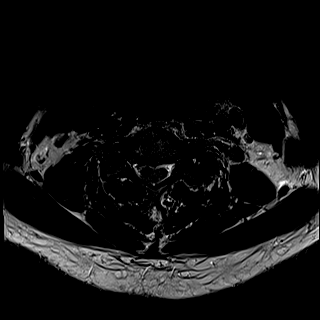
[im 18/34]
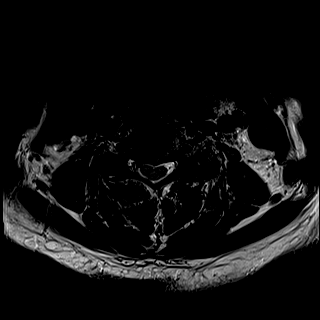
[im 23/34]
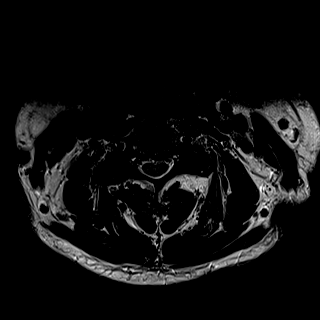
[im 28/34]
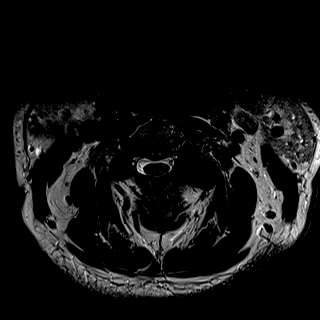
[im 34/34]
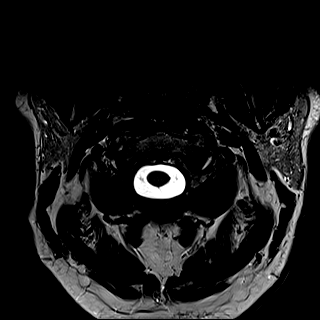

[27 of 48 positions shown; findings below may reference images not displayed]

FINDINGS: Alignment: There is straightening of the normal cervical lordosis
and moderate degenerative cervical spondylosis with mild multilevel
degenerative subluxations. The overall alignment is maintained.

Vertebrae: Normal marrow signal.  No bone lesions or fractures.

Cord: Normal cord signal intensity.  No cord lesions or syrinx.

Posterior Fossa, vertebral arteries, paraspinal tissues: No
significant findings. There are moderate degenerative changes at
C1-2 with pannus formation but no significant mass effect on the
upper cervical cord.

Disc levels:

C2-3: Mild annular bulge with slight impression on the ventral
thecal sac but no significant mass effect/neural compression. No
foraminal stenosis.

C3-4: Bulging annulus and mild osteophytic ridging. There is a E
right-sided disc osteophyte complex in conjunction with slight
asymmetric right-sided facet disease contributing to mild to
moderate right foraminal stenosis.

C4-5: Degenerative anterior subluxation of C4 compared to C5 with a
slight bulging uncovered disc. There is also mild osteophytic
ridging and significant left-sided facet disease. No significant
spinal or foraminal stenosis.

C5-6: Degenerative inter subluxation of C5 with a bulging uncovered
disc. There is also a central and left paracentral disc protrusion
with mild mass effect on the left side of the thecal sac. Mild
foraminal narrowing bilaterally.

C6-7: Bulging degenerated annulus, osteophytic ridging and uncinate
spurring with flattening of the ventral thecal sac and narrowing the
ventral CSF space. There is mild to moderate foraminal stenosis
bilaterally.

C7-T1: Bulging annulus and osteophytic ridging with slight
flattening of the ventral thecal sac. Mild foraminal stenosis, right
greater than left.
IMPRESSION: 1. Degenerative cervical spondylosis with multilevel degenerative
subluxations.
2. Mild to moderate right foraminal stenosis at C3-4.
3. Central and left paracentral disc protrusion at C5-6. There is
also mild bilateral foraminal narrowing.
4. Mild to moderate bilateral foraminal stenosis at C6-7.
5. Mild right greater than left foraminal stenosis at C7-T1.

## 2023-01-27 ENCOUNTER — Ambulatory Visit: Payer: Medicare Other

## 2023-01-27 NOTE — Progress Notes (Signed)
Inserts attempted to be fit, Il-fitting too narrow and not made properly I re-scanned patient for Langer orthotic UCB like in style to help better control over supination  and reduce ankle pain as well  Patient will return for fitting when in  Addison Bailey Cped, CFo, CFm

## 2023-02-03 ENCOUNTER — Other Ambulatory Visit: Payer: Self-pay | Admitting: Internal Medicine

## 2023-02-03 DIAGNOSIS — I5032 Chronic diastolic (congestive) heart failure: Secondary | ICD-10-CM

## 2023-02-14 DIAGNOSIS — Z96641 Presence of right artificial hip joint: Secondary | ICD-10-CM | POA: Insufficient documentation

## 2023-03-20 ENCOUNTER — Encounter: Payer: Self-pay | Admitting: Internal Medicine

## 2023-03-20 ENCOUNTER — Ambulatory Visit (INDEPENDENT_AMBULATORY_CARE_PROVIDER_SITE_OTHER): Payer: Medicare Other | Admitting: Internal Medicine

## 2023-03-20 VITALS — BP 124/82 | HR 70 | Temp 97.8°F | Resp 16 | Ht 72.0 in | Wt 192.0 lb

## 2023-03-20 DIAGNOSIS — I10 Essential (primary) hypertension: Secondary | ICD-10-CM | POA: Diagnosis not present

## 2023-03-20 DIAGNOSIS — I5032 Chronic diastolic (congestive) heart failure: Secondary | ICD-10-CM | POA: Diagnosis not present

## 2023-03-20 DIAGNOSIS — E785 Hyperlipidemia, unspecified: Secondary | ICD-10-CM | POA: Diagnosis not present

## 2023-03-20 LAB — BASIC METABOLIC PANEL
BUN: 14 mg/dL (ref 6–23)
CO2: 30 meq/L (ref 19–32)
Calcium: 9.7 mg/dL (ref 8.4–10.5)
Chloride: 102 meq/L (ref 96–112)
Creatinine, Ser: 0.73 mg/dL (ref 0.40–1.50)
GFR: 87.31 mL/min (ref >60.00)
Glucose, Bld: 93 mg/dL (ref 70–99)
Potassium: 4 meq/L (ref 3.5–5.1)
Sodium: 139 meq/L (ref 135–145)

## 2023-03-20 LAB — HEPATIC FUNCTION PANEL
ALT: 11 U/L (ref 0–53)
AST: 16 U/L (ref 0–37)
Albumin: 4.4 g/dL (ref 3.5–5.2)
Alkaline Phosphatase: 73 U/L (ref 39–117)
Bilirubin, Direct: 0.1 mg/dL (ref 0.0–0.3)
Total Bilirubin: 0.5 mg/dL (ref 0.2–1.2)
Total Protein: 7.8 g/dL (ref 6.0–8.3)

## 2023-03-20 LAB — CK: Total CK: 160 U/L (ref 7–232)

## 2023-03-20 LAB — CBC WITH DIFFERENTIAL/PLATELET
Basophils Absolute: 0 10*3/uL (ref 0.0–0.1)
Basophils Relative: 0.7 % (ref 0.0–3.0)
Eosinophils Absolute: 0.2 10*3/uL (ref 0.0–0.7)
Eosinophils Relative: 2.7 % (ref 0.0–5.0)
HCT: 40.3 % (ref 39.0–52.0)
Hemoglobin: 13.4 g/dL (ref 13.0–17.0)
Lymphocytes Relative: 24.9 % (ref 12.0–46.0)
Lymphs Abs: 1.5 10*3/uL (ref 0.7–4.0)
MCHC: 33.3 g/dL (ref 30.0–36.0)
MCV: 93.4 fL (ref 78.0–100.0)
Monocytes Absolute: 0.6 10*3/uL (ref 0.1–1.0)
Monocytes Relative: 9.5 % (ref 3.0–12.0)
Neutro Abs: 3.8 10*3/uL (ref 1.4–7.7)
Neutrophils Relative %: 62.2 % (ref 43.0–77.0)
Platelets: 263 10*3/uL (ref 150.0–400.0)
RBC: 4.31 Mil/uL (ref 4.22–5.81)
RDW: 14.2 % (ref 11.5–15.5)
WBC: 6.2 10*3/uL (ref 4.0–10.5)

## 2023-03-20 LAB — LIPID PANEL
Cholesterol: 173 mg/dL (ref 0–200)
HDL: 50.5 mg/dL (ref >39.00)
LDL Cholesterol: 64 mg/dL (ref 0–99)
NonHDL: 122.98
Total CHOL/HDL Ratio: 3
Triglycerides: 297 mg/dL — ABNORMAL HIGH (ref 0.0–149.0)
VLDL: 59.4 mg/dL — ABNORMAL HIGH (ref 0.0–40.0)

## 2023-03-20 MED ORDER — SIMVASTATIN 10 MG PO TABS: 10.0000 mg | ORAL_TABLET | Freq: Every day | ORAL | 1 refills | Status: DC

## 2023-03-20 MED ORDER — AMLODIPINE BESYLATE 5 MG PO TABS: 5.0000 mg | ORAL_TABLET | Freq: Every day | ORAL | 1 refills | Status: DC

## 2023-03-20 MED ORDER — LOSARTAN POTASSIUM 50 MG PO TABS: 50.0000 mg | ORAL_TABLET | Freq: Every day | ORAL | 1 refills | Status: DC

## 2023-03-20 MED ORDER — EMPAGLIFLOZIN 10 MG PO TABS: 10.0000 mg | ORAL_TABLET | Freq: Every day | ORAL | 1 refills | Status: DC

## 2023-03-20 NOTE — Patient Instructions (Signed)

## 2023-03-20 NOTE — Progress Notes (Signed)
Subjective:  Patient ID: Jonathan Ritter, male    DOB: Nov 22, 1945  Age: 77 y.o. MRN: [id number]  CC: Hypertension and Hyperlipidemia   HPI Jonathan Ritter presents for f/up ----  Discussed the use of AI scribe software for clinical note transcription with the patient, who gave verbal consent to proceed.  History of Present Illness   The patient, with a history of hip replacement, presents with an intermittent ache in the throat, described as similar to having swollen glands. This discomfort, which is not associated with difficulty swallowing or food impaction, has been evaluated by a neurosurgeon and an ENT specialist, with no definitive diagnosis. The patient denies any associated heartburn or indigestion, except when consuming cookies before bed, which is effectively managed with Tums.  The patient also reports no adverse effects from his statin medication, denying any muscle aches. He has been maintaining an active lifestyle with physical therapy following his hip replacement. He denies any chest pain, shortness of breath, or dizziness.       Outpatient Medications Prior to Visit  Medication Sig Dispense Refill   aspirin EC 81 MG tablet Take 81 mg by mouth daily.     Multiple Vitamin (MULTIVITAMIN) capsule Take 1 capsule by mouth daily.     Omega-3 Fatty Acids (FISH OIL PO) Take 1 capsule by mouth daily.     amLODipine (NORVASC) 5 MG tablet Take 1 tablet (5 mg total) by mouth daily. 90 tablet 1   empagliflozin (JARDIANCE) 10 MG TABS tablet TAKE 1 TABLET DAILY 90 tablet 0   losartan (COZAAR) 50 MG tablet Take 1 tablet (50 mg total) by mouth daily. 90 tablet 1   simvastatin (ZOCOR) 10 MG tablet Take 1 tablet (10 mg total) by mouth at bedtime. 90 tablet 1   No facility-administered medications prior to visit.    ROS Review of Systems  Constitutional: Negative.  Negative for appetite change, fatigue and unexpected weight change.  HENT: Negative.    Eyes: Negative.   Respiratory: Negative.   Negative for cough, chest tightness, shortness of breath and wheezing.   Cardiovascular:  Negative for chest pain, palpitations and leg swelling.  Gastrointestinal: Negative.  Negative for abdominal pain, constipation, diarrhea, nausea and vomiting.  Endocrine: Negative.   Genitourinary: Negative.  Negative for difficulty urinating, dysuria and hematuria.  Musculoskeletal:  Positive for arthralgias and neck pain. Negative for back pain and myalgias.  Skin: Negative.  Negative for color change and rash.  Neurological: Negative.  Negative for dizziness and weakness.  Hematological:  Negative for adenopathy. Does not bruise/bleed easily.  Psychiatric/Behavioral:  Positive for confusion and decreased concentration. The patient is not nervous/anxious.     Objective:  BP 124/82 (BP Location: Left Arm, Patient Position: Sitting, Cuff Size: Normal)   Pulse 70   Temp 97.8 F (36.6 C) (Oral)   Resp 16   Ht 6' (1.829 m)   Wt 192 lb (87.1 kg)   SpO2 97%   BMI 26.04 kg/m   BP Readings from Last 3 Encounters:  03/20/23 124/82  12/12/22 132/82  09/19/22 122/76    Wt Readings from Last 3 Encounters:  03/20/23 192 lb (87.1 kg)  12/12/22 190 lb 9.6 oz (86.5 kg)  10/14/22 195 lb (88.5 kg)    Physical Exam Vitals reviewed.  Constitutional:      Appearance: He is not ill-appearing.  HENT:     Mouth/Throat:     Mouth: Mucous membranes are moist.  Eyes:     General: No scleral  icterus.    Conjunctiva/sclera: Conjunctivae normal.  Neck:     Thyroid: No thyroid mass, thyromegaly or thyroid tenderness.     Trachea: Trachea normal.  Cardiovascular:     Rate and Rhythm: Normal rate and regular rhythm. Occasional Extrasystoles are present.    Heart sounds: No murmur heard.    No friction rub. No gallop.  Pulmonary:     Effort: Pulmonary effort is normal.     Breath sounds: No stridor. No wheezing, rhonchi or rales.  Abdominal:     General: Abdomen is flat.     Palpations: There is no  mass.     Tenderness: There is no abdominal tenderness. There is no guarding.     Hernia: No hernia is present.  Musculoskeletal:        General: Normal range of motion.     Cervical back: Neck supple. No edema.     Right lower leg: No edema.     Left lower leg: No edema.  Lymphadenopathy:     Cervical: No cervical adenopathy.     Right cervical: No superficial, deep or posterior cervical adenopathy.    Left cervical: No superficial, deep or posterior cervical adenopathy.  Skin:    General: Skin is warm and dry.  Neurological:     General: No focal deficit present.     Mental Status: He is alert. Mental status is at baseline.  Psychiatric:        Mood and Affect: Mood normal.        Behavior: Behavior normal.     Lab Results  Component Value Date   WBC 6.2 03/20/2023   HGB 13.4 03/20/2023   HCT 40.3 03/20/2023   PLT 263.0 03/20/2023   GLUCOSE 93 03/20/2023   CHOL 173 03/20/2023   TRIG 297.0 (H) 03/20/2023   HDL 50.50 03/20/2023   LDLCALC 64 03/20/2023   ALT 11 03/20/2023   AST 16 03/20/2023   NA 139 03/20/2023   K 4.0 03/20/2023   CL 102 03/20/2023   CREATININE 0.73 03/20/2023   BUN 14 03/20/2023   CO2 30 03/20/2023   TSH 3.25 12/12/2022    MR SHOULDER RIGHT WO CONTRAST Result Date: 12/08/2022 CLINICAL DATA:  Right shoulder pain for 6 months. EXAM: MRI OF THE RIGHT SHOULDER WITHOUT CONTRAST TECHNIQUE: Multiplanar, multisequence MR imaging of the shoulder was performed. No intravenous contrast was administered. COMPARISON:  None Available. FINDINGS: Rotator cuff: Complete tear of the supraspinatus tendon with 3.4 cm of retraction. Severe tendinosis of the infraspinatus tendon. Teres minor tendon is intact. Moderate tendinosis of the subscapularis tendon. Muscles: No muscle atrophy or edema. No intramuscular fluid collection or hematoma. Biceps Long Head: Intraarticular portion of the long head of the biceps tendon is not well visualized concerning for a high-grade  partial-thickness tear. Acromioclavicular Joint: Moderate arthropathy of the acromioclavicular joint. Small amount of subacromial/subdeltoid bursal fluid. Glenohumeral Joint: Small joint effusion. Partial-thickness cartilage loss of the glenohumeral joint with areas of high-grade partial-thickness cartilage loss involving the posterosuperior humeral head. Labrum: Severe superior labral degeneration. Bones: No fracture or dislocation. No aggressive osseous lesion. Other: No fluid collection or hematoma. IMPRESSION: 1. Complete tear of the supraspinatus tendon with 3.4 cm of retraction. 2. Severe tendinosis of the infraspinatus tendon. 3. Moderate tendinosis of the subscapularis tendon. 4. Intraarticular portion of the long head of the biceps tendon is not well visualized concerning for a high-grade partial-thickness tear. 5. Moderate osteoarthritis of the glenohumeral joint. Electronically Signed   By: Alan Ripper  Patel M.D.   On: 12/08/2022 07:25    Assessment & Plan:   Essential hypertension - His BP is well controlled. -     CBC with Differential/Platelet; Future -     amLODIPine Besylate; Take 1 tablet (5 mg total) by mouth daily.  Dispense: 90 tablet; Refill: 1 -     Basic metabolic panel; Future -     Losartan Potassium; Take 1 tablet (50 mg total) by mouth daily.  Dispense: 90 tablet; Refill: 1  Hyperlipidemia with target LDL less than 130 - LDL goal achieved. Doing well on the statin  -     Hepatic function panel; Future -     CK; Future -     Lipid panel; Future -     Simvastatin; Take 1 tablet (10 mg total) by mouth at bedtime.  Dispense: 90 tablet; Refill: 1  Chronic heart failure with preserved ejection fraction (HCC)- He has a normal volume status. -     Empagliflozin; Take 1 tablet (10 mg total) by mouth daily.  Dispense: 90 tablet; Refill: 1 -     Basic metabolic panel; Future     Follow-up: Return in about 6 months (around 09/18/2023).  Sanda Linger, MD

## 2023-03-27 ENCOUNTER — Telehealth: Payer: Self-pay

## 2023-03-27 NOTE — Telephone Encounter (Signed)
 Called pt to resch. Appt. Nicki Guadalajara will be out of office.

## 2023-03-28 ENCOUNTER — Other Ambulatory Visit: Payer: Medicare Other

## 2023-04-14 ENCOUNTER — Other Ambulatory Visit: Payer: Medicare Other

## 2023-04-16 ENCOUNTER — Ambulatory Visit: Payer: Medicare Other

## 2023-04-16 NOTE — Progress Notes (Signed)
Patient presents today to pick up custom molded foot orthotics, diagnosed with Metatarsus Adductus, Tsilor bunion BIL and Capsulitis by Dr. Carlota Raspberry.   Orthotics were dispensed patient was very happy with UCB style similar to what he had prior from [place] We Reviewed instructions for break-in and wear. Written instructions given to patient.  Patient will follow up as needed.  Addison Bailey CPed, CFo, CFm

## 2023-08-29 ENCOUNTER — Ambulatory Visit

## 2023-08-29 NOTE — Progress Notes (Signed)
 Patient was here c/o pressure on Base of 5th BIL  Offloads made to reduce pressure patient was happy with adjustment will call if he has any other issue

## 2023-09-18 ENCOUNTER — Ambulatory Visit (INDEPENDENT_AMBULATORY_CARE_PROVIDER_SITE_OTHER): Payer: Medicare Other | Admitting: Internal Medicine

## 2023-09-18 ENCOUNTER — Ambulatory Visit: Payer: Self-pay | Admitting: Internal Medicine

## 2023-09-18 ENCOUNTER — Encounter: Payer: Self-pay | Admitting: Internal Medicine

## 2023-09-18 VITALS — BP 134/76 | HR 67 | Temp 98.8°F | Ht 72.0 in | Wt 197.2 lb

## 2023-09-18 DIAGNOSIS — I482 Chronic atrial fibrillation, unspecified: Secondary | ICD-10-CM

## 2023-09-18 DIAGNOSIS — E785 Hyperlipidemia, unspecified: Secondary | ICD-10-CM | POA: Diagnosis not present

## 2023-09-18 DIAGNOSIS — I5032 Chronic diastolic (congestive) heart failure: Secondary | ICD-10-CM | POA: Diagnosis not present

## 2023-09-18 DIAGNOSIS — I10 Essential (primary) hypertension: Secondary | ICD-10-CM

## 2023-09-18 LAB — CBC WITH DIFFERENTIAL/PLATELET
Basophils Absolute: 0 10*3/uL (ref 0.0–0.1)
Basophils Relative: 0.7 % (ref 0.0–3.0)
Eosinophils Absolute: 0.2 10*3/uL (ref 0.0–0.7)
Eosinophils Relative: 2.6 % (ref 0.0–5.0)
HCT: 44.2 % (ref 39.0–52.0)
Hemoglobin: 14.9 g/dL (ref 13.0–17.0)
Lymphocytes Relative: 22.3 % (ref 12.0–46.0)
Lymphs Abs: 1.4 10*3/uL (ref 0.7–4.0)
MCHC: 33.7 g/dL (ref 30.0–36.0)
MCV: 92.9 fl (ref 78.0–100.0)
Monocytes Absolute: 0.6 10*3/uL (ref 0.1–1.0)
Monocytes Relative: 9.7 % (ref 3.0–12.0)
Neutro Abs: 4 10*3/uL (ref 1.4–7.7)
Neutrophils Relative %: 64.7 % (ref 43.0–77.0)
Platelets: 243 10*3/uL (ref 150.0–400.0)
RBC: 4.75 Mil/uL (ref 4.22–5.81)
RDW: 14.2 % (ref 11.5–15.5)
WBC: 6.2 10*3/uL (ref 4.0–10.5)

## 2023-09-18 LAB — BASIC METABOLIC PANEL WITH GFR
BUN: 20 mg/dL (ref 6–23)
CO2: 29 meq/L (ref 19–32)
Calcium: 10.1 mg/dL (ref 8.4–10.5)
Chloride: 102 meq/L (ref 96–112)
Creatinine, Ser: 0.78 mg/dL (ref 0.40–1.50)
GFR: 85.28 mL/min (ref >60.00)
Glucose, Bld: 91 mg/dL (ref 70–99)
Potassium: 4.4 meq/L (ref 3.5–5.1)
Sodium: 138 meq/L (ref 135–145)

## 2023-09-18 LAB — TSH: TSH: 3.48 u[IU]/mL (ref 0.35–5.50)

## 2023-09-18 LAB — CK: Total CK: 195 U/L (ref 7–232)

## 2023-09-18 MED ORDER — SIMVASTATIN 10 MG PO TABS
10.0000 mg | ORAL_TABLET | Freq: Every day | ORAL | 1 refills | Status: DC
Start: 2023-09-18 — End: 2024-01-19

## 2023-09-18 NOTE — Progress Notes (Signed)
 Subjective:  Patient ID: Jonathan Ritter, male    DOB: 07-15-1945  Age: 78 y.o. MRN: [id number]  CC: Hypertension, Hyperlipidemia, and Atrial Fibrillation   HPI Jonathan Ritter presents for f/up ----   Discussed the use of AI scribe software for clinical note transcription with the patient, who gave verbal consent to proceed.  History of Present Illness   Jonathan Ritter is a 78 year old male who presents with shoulder pain and recent weight gain.  He experiences sharp pain in his shoulder, limiting his range of motion and preventing him from lifting his shoulder beyond a certain point. He has been taking ibuprofen to manage the pain. He recently underwent hip surgery, which he states went well, and he has recovered from it.  The heat has been affecting him negatively, causing him to stay indoors and eat more, leading to a weight gain of about five to six pounds over the last two weeks to a month. He feels fatigue associated with the heat.  No shortness of breath, dizziness, lightheadedness, or irregular heartbeats. He does not report any significant swelling in his legs or feet, although he experiences some joint stiffness. He maintains an exercise routine on a machine daily.       Outpatient Medications Prior to Visit  Medication Sig Dispense Refill   amLODipine  (NORVASC ) 5 MG tablet Take 1 tablet (5 mg total) by mouth daily. 90 tablet 1   aspirin EC 81 MG tablet Take 81 mg by mouth daily.     empagliflozin  (JARDIANCE ) 10 MG TABS tablet Take 1 tablet (10 mg total) by mouth daily. 90 tablet 1   IBUPROFEN PO Take 2 tablets by mouth daily.     losartan  (COZAAR ) 50 MG tablet Take 1 tablet (50 mg total) by mouth daily. 90 tablet 1   Multiple Vitamin (MULTIVITAMIN) capsule Take 1 capsule by mouth daily.     Omega-3 Fatty Acids (FISH OIL PO) Take 1 capsule by mouth daily.     simvastatin  (ZOCOR ) 10 MG tablet Take 1 tablet (10 mg total) by mouth at bedtime. 90 tablet 1   No facility-administered  medications prior to visit.    ROS Review of Systems  Constitutional:  Positive for fatigue and unexpected weight change. Negative for appetite change, chills, diaphoresis and fever.  HENT: Negative.    Respiratory: Negative.  Negative for cough, chest tightness and wheezing.   Cardiovascular:  Negative for chest pain, palpitations and leg swelling.  Gastrointestinal:  Negative for abdominal pain, constipation, diarrhea, nausea and vomiting.  Genitourinary: Negative.  Negative for difficulty urinating.  Musculoskeletal: Negative.  Negative for arthralgias and myalgias.  Skin: Negative.   Neurological:  Negative for dizziness, weakness and light-headedness.  Hematological:  Negative for adenopathy. Does not bruise/bleed easily.    Objective:  BP 134/76 (BP Location: Left Arm, Patient Position: Sitting, Cuff Size: Normal)   Pulse 67   Temp 98.8 F (37.1 C) (Temporal)   Ht 6' (1.829 m)   Wt 197 lb 3.2 oz (89.4 kg)   SpO2 97%   BMI 26.75 kg/m   BP Readings from Last 3 Encounters:  09/18/23 134/76  03/20/23 124/82  12/12/22 132/82    Wt Readings from Last 3 Encounters:  09/18/23 197 lb 3.2 oz (89.4 kg)  03/20/23 192 lb (87.1 kg)  12/12/22 190 lb 9.6 oz (86.5 kg)    Physical Exam Vitals reviewed.  Constitutional:      Appearance: Normal appearance.  HENT:     Mouth/Throat:  Mouth: Mucous membranes are moist.   Eyes:     General: No scleral icterus.    Conjunctiva/sclera: Conjunctivae normal.    Cardiovascular:     Rate and Rhythm: Normal rate. Rhythm irregularly irregular.     Heart sounds: No murmur heard.    No friction rub. No gallop.     Comments: EKG---  A fib (new), 63 bpm  Septal infarct pattern is new Anterior infarct pattern is more pronounced Pulmonary:     Effort: Pulmonary effort is normal.     Breath sounds: No stridor. No wheezing, rhonchi or rales.  Abdominal:     General: Abdomen is flat.     Palpations: There is no mass.      Tenderness: There is no abdominal tenderness. There is no guarding.     Hernia: No hernia is present.   Musculoskeletal:        General: Normal range of motion.     Cervical back: Neck supple.     Right lower leg: No edema.     Left lower leg: No edema.   Skin:    General: Skin is warm and dry.   Neurological:     General: No focal deficit present.     Mental Status: He is alert.   Psychiatric:        Mood and Affect: Mood normal.        Behavior: Behavior normal.     Lab Results  Component Value Date   WBC 6.2 09/18/2023   HGB 14.9 09/18/2023   HCT 44.2 09/18/2023   PLT 243.0 09/18/2023   GLUCOSE 91 09/18/2023   CHOL 173 03/20/2023   TRIG 297.0 (H) 03/20/2023   HDL 50.50 03/20/2023   LDLCALC 64 03/20/2023   ALT 11 03/20/2023   AST 16 03/20/2023   NA 138 09/18/2023   K 4.4 09/18/2023   CL 102 09/18/2023   CREATININE 0.78 09/18/2023   BUN 20 09/18/2023   CO2 29 09/18/2023   TSH 3.48 09/18/2023    MR SHOULDER RIGHT WO CONTRAST Result Date: 12/08/2022 CLINICAL DATA:  Right shoulder pain for 6 months. EXAM: MRI OF THE RIGHT SHOULDER WITHOUT CONTRAST TECHNIQUE: Multiplanar, multisequence MR imaging of the shoulder was performed. No intravenous contrast was administered. COMPARISON:  None Available. FINDINGS: Rotator cuff: Complete tear of the supraspinatus tendon with 3.4 cm of retraction. Severe tendinosis of the infraspinatus tendon. Teres minor tendon is intact. Moderate tendinosis of the subscapularis tendon. Muscles: No muscle atrophy or edema. No intramuscular fluid collection or hematoma. Biceps Long Head: Intraarticular portion of the long head of the biceps tendon is not well visualized concerning for a high-grade partial-thickness tear. Acromioclavicular Joint: Moderate arthropathy of the acromioclavicular joint. Small amount of subacromial/subdeltoid bursal fluid. Glenohumeral Joint: Small joint effusion. Partial-thickness cartilage loss of the glenohumeral joint  with areas of high-grade partial-thickness cartilage loss involving the posterosuperior humeral head. Labrum: Severe superior labral degeneration. Bones: No fracture or dislocation. No aggressive osseous lesion. Other: No fluid collection or hematoma. IMPRESSION: 1. Complete tear of the supraspinatus tendon with 3.4 cm of retraction. 2. Severe tendinosis of the infraspinatus tendon. 3. Moderate tendinosis of the subscapularis tendon. 4. Intraarticular portion of the long head of the biceps tendon is not well visualized concerning for a high-grade partial-thickness tear. 5. Moderate osteoarthritis of the glenohumeral joint. Electronically Signed   By: Julaine Blanch M.D.   On: 12/08/2022 07:25    Assessment & Plan:  Essential hypertension- His BP is well controlled. -  Basic metabolic panel with GFR; Future -     CBC with Differential/Platelet; Future -     TSH; Future -     EKG 12-Lead  Chronic atrial fibrillation (HCC)- Will hold on the DOAC for now. -     TSH; Future -     Ambulatory referral to Cardiology  Hyperlipidemia with target LDL less than 130- LDL goal achieved. Doing well on the statin  -     Simvastatin ; Take 1 tablet (10 mg total) by mouth at bedtime.  Dispense: 90 tablet; Refill: 1 -     CK; Future  Chronic heart failure with preserved ejection fraction (HCC)- No signs of fluid excess. -     Ambulatory referral to Cardiology     Follow-up: Return in about 4 months (around 01/18/2024).  Debby Molt, MD

## 2023-09-18 NOTE — Patient Instructions (Signed)
 Atrial Fibrillation Atrial fibrillation (AFib) is a type of irregular or rapid heartbeat (arrhythmia). In AFib, the top part of the heart (atria) beats in an irregular pattern. This makes the heart unable to pump blood normally and effectively. The goal of treatment is to prevent blood clots from forming, control your heart rate, or restore your heartbeat to a normal rhythm. If this condition is not treated, it can cause serious problems, such as a weakened heart muscle (cardiomyopathy) or a stroke. What are the causes? This condition is often caused by medical conditions that damage the heart's electrical system. These include: High blood pressure (hypertension). This is the most common cause. Certain heart problems or conditions, such as heart failure, coronary artery disease, heart valve problems, or heart surgery. Diabetes. Overactive thyroid (hyperthyroidism). Chronic kidney disease. Certain lung conditions, such as emphysema, pneumonia, or COPD. Obstructive sleep apnea. In some cases, the cause of this condition is not known. What increases the risk? This condition is more likely to develop in: Older adults. Athletes who do endurance exercise. People who have a family history of AFib. Males. People who are Caucasian. People who are obese. People who smoke or misuse alcohol. What are the signs or symptoms? Symptoms of this condition include: Fast or irregular heartbeats (palpitations). Discomfort or pain in your chest. Shortness of breath. Sudden light-headedness or weakness. Tiring easily during exercise or activity. Syncope (fainting). Sweating. In some cases, there are no symptoms. How is this diagnosed? Your health care provider may detect AFib when taking your pulse. If detected, this condition may be diagnosed with: An electrocardiogram (ECG) to check electrical signals of the heart. An ambulatory cardiac monitor to record your heart's activity for a few days. A  transthoracic echocardiogram (TTE) to create pictures of your heart. A transesophageal echocardiogram (TEE) to create even clearer pictures of your heart. A stress test to check your blood supply while you exercise. Imaging tests, such as a CT scan or chest X-ray. Blood tests. How is this treated? Treatment depends on underlying conditions and how you feel when you get AFib. This condition may be treated with: Medicines to prevent blood clots or to treat heart rate or heart rhythm problems. Electrical cardioversion to reset the heart's rhythm. A pacemaker to correct abnormal heart rhythm. Ablation to remove the heart tissue that sends abnormal signals. Left atrial appendage closure to seal the area where blood clots can form. In some cases, underlying conditions will be treated. Follow these instructions at home: Medicines Take over-the counter and prescription medicines only as told by your provider. Do not take any new medicines without talking to your provider. If you are taking blood thinners: Talk with your provider before taking aspirin or NSAIDs. These medicines can raise your risk of bleeding. Take your medicines as told. Take them at the same time each day. Do not do things that could hurt or bruise you. Be careful to avoid falls. Wear an alert bracelet or carry a card that says that you take blood thinners. Lifestyle Do not use any products that contain nicotine or tobacco. These products include cigarettes, chewing tobacco, and vaping devices, such as e-cigarettes. If you need help quitting, ask your provider. Eat heart-healthy foods. Talk with a food expert (dietitian) to make an eating plan that is right for you. Exercise regularly as told by your provider. Do not drink alcohol. Lose weight if you are overweight. General instructions If you have obstructive sleep apnea, manage your condition as told by your provider.  Do not use diet pills unless your provider approves. Diet  pills can make heart problems worse. Keep all follow-up visits. Your provider will want to check your heart rate and rhythm regularly. Contact a health care provider if: You notice a change in the rate, rhythm, or strength of your heartbeat. You are taking a blood thinner and you notice more bruising. You tire more easily when you exercise or do heavy work. You have a sudden change in weight. Get help right away if:  You have chest pain. You have trouble breathing. You have side effects of blood thinners, such as blood in your vomit, poop (stool), or pee (urine), or bleeding that does not stop. You have any symptoms of a stroke. "BE FAST" is an easy way to remember the main warning signs of a stroke: B - Balance. Signs are dizziness, sudden trouble walking, or loss of balance. E - Eyes. Signs are trouble seeing or a sudden change in vision. F - Face. Signs are sudden weakness or numbness of the face, or the face or eyelid drooping on one side. A - Arms. Signs are weakness or numbness in an arm. This happens suddenly and usually on one side of the body. S - Speech.Signs are sudden trouble speaking, slurred speech, or trouble understanding what people say. T - Time. Time to call emergency services. Write down what time symptoms started. Other signs of a stroke, such as: A sudden, severe headache with no known cause. Nausea or vomiting. Seizure. These symptoms may be an emergency. Get help right away. Call 911. Do not wait to see if the symptoms will go away. Do not drive yourself to the hospital. This information is not intended to replace advice given to you by your health care provider. Make sure you discuss any questions you have with your health care provider. Document Revised: 11/28/2021 Document Reviewed: 11/28/2021 Elsevier Patient Education  2024 [employer].

## 2023-09-19 ENCOUNTER — Other Ambulatory Visit: Payer: Self-pay | Admitting: Internal Medicine

## 2023-09-19 DIAGNOSIS — I10 Essential (primary) hypertension: Secondary | ICD-10-CM

## 2023-10-08 ENCOUNTER — Other Ambulatory Visit: Payer: Self-pay | Admitting: Internal Medicine

## 2023-10-08 DIAGNOSIS — I5032 Chronic diastolic (congestive) heart failure: Secondary | ICD-10-CM

## 2023-10-08 DIAGNOSIS — I10 Essential (primary) hypertension: Secondary | ICD-10-CM

## 2023-10-27 ENCOUNTER — Encounter: Payer: Self-pay | Admitting: Internal Medicine

## 2023-11-04 ENCOUNTER — Encounter: Payer: Self-pay | Admitting: Internal Medicine

## 2023-11-30 ENCOUNTER — Encounter: Payer: Self-pay | Admitting: Internal Medicine

## 2023-12-04 NOTE — Progress Notes (Signed)
 Cardiology Office Note:   Date:  12/04/2023  ID:  Jonathan Ritter, DOB 04/28/1945, MRN [id number] PCP: Joshua Debby CROME, MD  [facility] Providers Cardiologist:  None { Chief Complaint: No chief complaint on file.     History of Present Illness:   Jonathan Ritter is a 78 y.o. male with a PMH of HTN and HLD who presents as a new referral by Dr. Joshua for the evaluation of atrial fibrillation and HFpEF.   Past Medical History:  Diagnosis Date   Cancer (HCC)    prostate   High cholesterol    Hypertension     Fam Hx:  Studies Reviewed:    EKG: ***       Cardiac Studies & Procedures   ______________________________________________________________________________________________   STRESS TESTS  MYOCARDIAL PERFUSION IMAGING 01/03/2022  Interpretation Summary   Findings are consistent with no prior ischemia. The study is intermediate risk.   No ST deviation was noted.   LV perfusion is normal.   Left ventricular function is abnormal. Global function is mildly reduced. Nuclear stress EF: 46 %. The left ventricular ejection fraction is mildly decreased (45-54%). End diastolic cavity size is mildly enlarged. End systolic cavity size is mildly enlarged.   Prior study not available for comparison.  No reversible ischemia. LVEF 46% with basal to mid inferior wall hypokinesis (may be artifact d/t bowel attenuation) - recommend echo correlation. This is an intermediate risk study (d/t reduced LVEF). Clinical correlation is advised. No prior for comparison.   ECHOCARDIOGRAM  ECHOCARDIOGRAM COMPLETE 01/17/2022  Narrative ECHOCARDIOGRAM REPORT    Patient Name:   Jonathan Ritter Date of Exam: 01/17/2022 Medical Rec #:  [id number]   Height:       72.0 in Accession #:    [id number]  Weight:       198.0 lb Date of Birth:  1945-07-28   BSA:          2.121 m Patient Age:    6 years    BP:           110/73 mmHg Patient Gender: M           HR:           73 bpm. Exam Location:   Outpatient  Procedure: 3D Echo, 2D Echo, Color Doppler, Cardiac Doppler and Strain Analysis  Indications:    R94.31 Abnormal EKG  History:        Patient has no prior history of Echocardiogram examinations. Abnormal ECG; Risk Factors:Hypertension and Non-Smoker.  Sonographer:    Orvil Holmes RDCS Referring Phys: [phone] DEBBY CROME JOSHUA  IMPRESSIONS   1. Left ventricular ejection fraction, by estimation, is 55 to 60%. The left ventricle has normal function. The left ventricle has no regional wall motion abnormalities. Left ventricular diastolic parameters are consistent with Grade I diastolic dysfunction (impaired relaxation). 2. Right ventricular systolic function is normal. The right ventricular size is normal. There is normal pulmonary artery systolic pressure. The estimated right ventricular systolic pressure is 30.9 mmHg. 3. The mitral valve is grossly normal. No evidence of mitral valve regurgitation. No evidence of mitral stenosis. 4. The aortic valve is tricuspid. Aortic valve regurgitation is not visualized. No aortic stenosis is present. 5. There is mild dilatation of the ascending aorta, measuring 42 mm. 6. The inferior vena cava is normal in size with greater than 50% respiratory variability, suggesting right atrial pressure of 3 mmHg.  FINDINGS Left Ventricle: Left ventricular ejection fraction, by estimation, is 55 to 60%. The  left ventricle has normal function. The left ventricle has no regional wall motion abnormalities. Global longitudinal strain performed but not reported based on interpreter judgement due to suboptimal tracking. 3D left ventricular ejection fraction analysis performed but not reported based on interpreter judgement due to suboptimal tracking. The left ventricular internal cavity size was normal in size. There is no left ventricular hypertrophy. Left ventricular diastolic parameters are consistent with Grade I diastolic dysfunction (impaired  relaxation).  Right Ventricle: The right ventricular size is normal. No increase in right ventricular wall thickness. Right ventricular systolic function is normal. There is normal pulmonary artery systolic pressure. The tricuspid regurgitant velocity is 2.64 m/s, and with an assumed right atrial pressure of 3 mmHg, the estimated right ventricular systolic pressure is 30.9 mmHg.  Left Atrium: Left atrial size was normal in size.  Right Atrium: Right atrial size was normal in size.  Pericardium: There is no evidence of pericardial effusion.  Mitral Valve: The mitral valve is grossly normal. No evidence of mitral valve regurgitation. No evidence of mitral valve stenosis.  Tricuspid Valve: The tricuspid valve is grossly normal. Tricuspid valve regurgitation is trivial. No evidence of tricuspid stenosis.  Aortic Valve: The aortic valve is tricuspid. Aortic valve regurgitation is not visualized. No aortic stenosis is present.  Pulmonic Valve: The pulmonic valve was grossly normal. Pulmonic valve regurgitation is trivial. No evidence of pulmonic stenosis.  Aorta: The aortic root is normal in size and structure. There is mild dilatation of the ascending aorta, measuring 42 mm.  Venous: The inferior vena cava is normal in size with greater than 50% respiratory variability, suggesting right atrial pressure of 3 mmHg.  IAS/Shunts: The atrial septum is grossly normal.   LEFT VENTRICLE PLAX 2D LVIDd:         4.45 cm   Diastology LVIDs:         2.71 cm   LV e' medial:    6.53 cm/s LV PW:         1.19 cm   LV E/e' medial:  7.5 LV IVS:        1.05 cm   LV e' lateral:   9.03 cm/s LVOT diam:     2.20 cm   LV E/e' lateral: 5.4 LV SV:         56 LV SV Index:   26 LVOT Area:     3.80 cm  3D Volume EF: 3D EF:        43 % LV EDV:       129 ml LV ESV:       74 ml LV SV:        56 ml  RIGHT VENTRICLE RV Basal diam:  4.88 cm RV Mid diam:    4.00 cm RV S prime:     17.90 cm/s TAPSE (M-mode):  3.5 cm  LEFT ATRIUM             Index        RIGHT ATRIUM           Index LA diam:        4.00 cm 1.89 cm/m   RA Area:     20.40 cm LA Vol (A2C):   67.3 ml 31.72 ml/m  RA Volume:   56.80 ml  26.77 ml/m LA Vol (A4C):   40.3 ml 19.00 ml/m LA Biplane Vol: 54.8 ml 25.83 ml/m AORTIC VALVE LVOT Vmax:   72.50 cm/s LVOT Vmean:  48.800 cm/s LVOT VTI:  0.147 m  AORTA Ao Root diam: 3.60 cm Ao Asc diam:  4.10 cm  MITRAL VALVE               TRICUSPID VALVE MV Area (PHT): 2.91 cm    TR Peak grad:   27.9 mmHg MV Decel Time: 261 msec    TR Vmax:        264.00 cm/s MV E velocity: 48.80 cm/s MV A velocity: 82.30 cm/s  SHUNTS MV E/A ratio:  0.59        Systemic VTI:  0.15 m Systemic Diam: 2.20 cm  Darryle Decent MD Electronically signed by Darryle Decent MD Signature Date/Time: 01/17/2022/5:41:31 PM    Final          ______________________________________________________________________________________________      Risk Assessment/Calculations:   {Does this patient have ATRIAL FIBRILLATION?[phone]} No BP recorded.  {Refresh Note OR Click here to enter BP  :1}***        Physical Exam:     VS:  There were no vitals taken for this visit. ***    Wt Readings from Last 3 Encounters:  09/18/23 197 lb 3.2 oz (89.4 kg)  03/20/23 192 lb (87.1 kg)  12/12/22 190 lb 9.6 oz (86.5 kg)     GEN: Well nourished, well developed, in no acute distress NECK: No JVD; No carotid bruits CARDIAC: ***RRR, no murmurs, rubs, gallops RESPIRATORY:  Clear to auscultation without rales, wheezing or rhonchi  ABDOMEN: Soft, non-tender, non-distended, normal bowel sounds EXTREMITIES:  Warm and well perfused, no edema; No deformity, 2+ radial pulses PSYCH: Normal mood and affect   Assessment & Plan       {Are you ordering a CV Procedure (e.g. stress test, cath, DCCV, TEE, etc)?   Press F2        :[id number]}   This note was written with the assistance of a dictation microphone or AI  dictation software. Please excuse any typos or grammatical errors.   Signed, Georganna Archer, MD 12/04/2023 3:02 PM    [facility]

## 2023-12-05 ENCOUNTER — Ambulatory Visit
Attending: Student in an Organized Health Care Education/Training Program | Admitting: Student in an Organized Health Care Education/Training Program

## 2023-12-05 ENCOUNTER — Encounter: Payer: Self-pay | Admitting: Student in an Organized Health Care Education/Training Program

## 2023-12-05 VITALS — BP 132/80 | HR 55 | Resp 16 | Ht 72.0 in | Wt 193.0 lb

## 2023-12-05 DIAGNOSIS — Z789 Other specified health status: Secondary | ICD-10-CM | POA: Insufficient documentation

## 2023-12-05 DIAGNOSIS — E782 Mixed hyperlipidemia: Secondary | ICD-10-CM | POA: Diagnosis present

## 2023-12-05 DIAGNOSIS — I491 Atrial premature depolarization: Secondary | ICD-10-CM | POA: Diagnosis present

## 2023-12-05 DIAGNOSIS — I5189 Other ill-defined heart diseases: Secondary | ICD-10-CM | POA: Insufficient documentation

## 2023-12-05 DIAGNOSIS — I77819 Aortic ectasia, unspecified site: Secondary | ICD-10-CM | POA: Insufficient documentation

## 2023-12-05 NOTE — Assessment & Plan Note (Signed)
 Patient referred due to concern for atrial fibrillation.  I have carefully reviewed all of the patient's available ECGs and thankfully he does not have atrial fibrillation.  The ECG that was taken on 09/18/2023 was erroneously interpreted by the machine as being atrial fibrillation when in fact the rhythm is irregular due to the presence of PACs.  Repeat ECG today also shows NSR with PACs.  PACs are benign and the patient is asymptomatic.  No further workup or treatment is indicated at this time.

## 2023-12-05 NOTE — Assessment & Plan Note (Signed)
 I will check a lipid panel and lipoprotein a today for risk stratification.  He stopped taking simvastatin  and previously stopped taking rosuvastatin  both for severe myalgias.  Will consider alternative therapies to statins if indicated based on his cholesterol levels. - Lipoprotein a and lipid panel while fasting

## 2023-12-05 NOTE — Patient Instructions (Signed)
 Medication Instructions:  STOP Jardiance    *If you need a refill on your cardiac medications before your next appointment, please call your pharmacy*  Lab Work: Lipid panel -fasting  Bmp Lpa  If you have labs (blood work) drawn today and your tests are completely normal, you will receive your results only by: MyChart Message (if you have MyChart) OR A paper copy in the mail If you have any lab test that is abnormal or we need to change your treatment, we will call you to review the results.  Testing/Procedures: CTA Aorta   Non-Cardiac CT scanning, (CAT scanning), is a noninvasive, special x-ray that produces cross-sectional images of the body using x-rays and a computer. CT scans help physicians diagnose and treat medical conditions. For some CT exams, a contrast material is used to enhance visibility in the area of the body being studied. CT scans provide greater clarity and reveal more details than regular x-ray exams.   Follow-Up: At [facility], you and your health needs are our priority.  As part of our continuing mission to provide you with exceptional heart care, our providers are all part of one team.  This team includes your primary Cardiologist (physician) and Advanced Practice Providers or APPs (Physician Assistants and Nurse Practitioners) who all work together to provide you with the care you need, when you need it.  Your next appointment:   1 year(s)  Provider:   Georganna Archer, MD

## 2023-12-05 NOTE — Assessment & Plan Note (Deleted)
Fasting lipid profile.  

## 2023-12-08 ENCOUNTER — Ambulatory Visit (HOSPITAL_COMMUNITY)
Admission: RE | Admit: 2023-12-08 | Discharge: 2023-12-08 | Disposition: A | Discharge status: Home / Self Care | Source: Ambulatory Visit | Attending: Student in an Organized Health Care Education/Training Program | Admitting: Student in an Organized Health Care Education/Training Program

## 2023-12-08 DIAGNOSIS — I77819 Aortic ectasia, unspecified site: Secondary | ICD-10-CM

## 2023-12-08 DIAGNOSIS — I7121 Aneurysm of the ascending aorta, without rupture: Secondary | ICD-10-CM | POA: Insufficient documentation

## 2023-12-08 MED ORDER — IOHEXOL 350 MG/ML SOLN
80.0000 mL | Freq: Once | INTRAVENOUS | Status: AC | PRN
  Administered 2023-12-08: 80 mL via INTRAVENOUS

## 2023-12-09 ENCOUNTER — Ambulatory Visit

## 2023-12-09 LAB — BASIC METABOLIC PANEL WITH GFR
BUN/Creatinine Ratio: 27 — ABNORMAL HIGH (ref 10–24)
BUN: 20 mg/dL (ref 8–27)
CO2: 20 mmol/L (ref 20–29)
Calcium: 9.7 mg/dL (ref 8.6–10.2)
Chloride: 104 mmol/L (ref 96–106)
Creatinine, Ser: 0.75 mg/dL — ABNORMAL LOW (ref 0.76–1.27)
Glucose: 96 mg/dL (ref 70–99)
Potassium: 4.6 mmol/L (ref 3.5–5.2)
Sodium: 141 mmol/L (ref 134–144)
eGFR: 92 mL/min/1.73 (ref >59)

## 2023-12-09 LAB — LIPID PANEL
Chol/HDL Ratio: 5.2 ratio — ABNORMAL HIGH (ref 0.0–5.0)
Cholesterol, Total: 246 mg/dL — ABNORMAL HIGH (ref 100–199)
HDL: 47 mg/dL (ref >39)
LDL Chol Calc (NIH): 164 mg/dL — ABNORMAL HIGH (ref 0–99)
Triglycerides: 192 mg/dL — ABNORMAL HIGH (ref 0–149)
VLDL Cholesterol Cal: 35 mg/dL (ref 5–40)

## 2023-12-09 LAB — LIPOPROTEIN A (LPA): Lipoprotein (a): <8.4 nmol/L (ref <75.0)

## 2023-12-10 ENCOUNTER — Ambulatory Visit: Payer: Self-pay | Admitting: Student in an Organized Health Care Education/Training Program

## 2023-12-10 DIAGNOSIS — K7689 Other specified diseases of liver: Secondary | ICD-10-CM

## 2023-12-10 DIAGNOSIS — E782 Mixed hyperlipidemia: Secondary | ICD-10-CM

## 2023-12-11 ENCOUNTER — Encounter: Payer: Self-pay | Admitting: Student in an Organized Health Care Education/Training Program

## 2023-12-11 ENCOUNTER — Encounter: Payer: Self-pay | Admitting: Internal Medicine

## 2023-12-16 ENCOUNTER — Telehealth: Admitting: Physician Assistant

## 2023-12-16 DIAGNOSIS — B09 Unspecified viral infection characterized by skin and mucous membrane lesions: Secondary | ICD-10-CM | POA: Diagnosis not present

## 2023-12-16 NOTE — Progress Notes (Signed)
 E Visit for Rash  We are sorry that you are not feeling well. Here is how we plan to help!  Based on the information provided you may be having a rash secondary to a viral illness, called a viral exanthem.   There is also a possibility this could be another viral rash called Pityriasis rosea.  Based on the photos it is difficult to differentiate, however, both types of rashes are self-limited, meaning they will normally improve on their own without medical involvement.   I recommend you take Zyrtec 10 mg once daily as needed to control the symptoms of itching, but if they last over 72 hours it is best that you see an office based provider for follow up.   HOME CARE:  Take cool showers and avoid direct sunlight. Apply cool compress or wet dressings. Take a bath in an oatmeal bath.  Sprinkle content of one Aveeno packet under running faucet with comfortably warm water.  Bathe for 15-20 minutes, 1-2 times daily.  Pat dry with a towel. Do not rub the rash. Use hydrocortisone cream. Take an antihistamine like Benadryl for widespread rashes that itch.  The adult dose of Benadryl is 25-50 mg by mouth 4 times daily. Caution:  This type of medication may cause sleepiness.  Do not drink alcohol, drive, or operate dangerous machinery while taking antihistamines.  Do not take these medications if you have prostate enlargement.  Read package instructions thoroughly on all medications that you take.  GET HELP RIGHT AWAY IF:  Symptoms don't go away after treatment. Severe itching that persists. If you rash spreads or swells. If you rash begins to smell. If it blisters and opens or develops a yellow-brown crust. You develop a fever. You have a sore throat. You become short of breath.  MAKE SURE YOU:  Understand these instructions. Will watch your condition. Will get help right away if you are not doing well or get worse.  Thank you for choosing an e-visit.  Your e-visit answers were reviewed  by a board certified advanced clinical practitioner to complete your personal care plan. Depending upon the condition, your plan could have included both over the counter or prescription medications.  Please review your pharmacy choice. Make sure the pharmacy is open so you can pick up prescription now. If there is a problem, you may contact your provider through [employer] and have the prescription routed to another pharmacy.  Your safety is important to us . If you have drug allergies check your prescription carefully.   For the next 24 hours you can use MyChart to ask questions about today's visit, request a non-urgent call back, or ask for a work or school excuse. You will get an email in the next two days asking about your experience. I hope that your e-visit has been valuable and will speed your recovery.     I have spent 5 minutes in review of e-visit questionnaire, review and updating patient chart, medical decision making and response to patient.   Delon CHRISTELLA Dickinson, PA-C

## 2023-12-30 NOTE — Progress Notes (Signed)
 Adjustments made patient is happy will call if he has any other issues

## 2024-01-15 NOTE — Progress Notes (Unsigned)
 01/16/2024 Lamar Motto [id number] [birth year]  Referring provider: Floretta Mallard, MD Primary GI doctor: Dr. 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 History of prostate cancer 2005, s/p proctectomy  Brother with history of HCC due to ETOH cirrhosis Denies ETOH, remote history of fatty liver and elevated alk phos per patient 10/17/2023 unremarkable liver function, lipase, elevated WBC no anemia    Latest Ref Rng & Units 03/20/2023   10:34 AM 12/25/2021   12:34 PM 02/17/2015   12:00 PM  Hepatic Function  Total Protein 6.0 - 8.3 g/dL 7.8  8.0  8.3   Albumin 3.5 - 5.2 g/dL 4.4  4.8  5.0   AST 0 - 37 U/L 16  31  42   ALT 0 - 53 U/L 11  30  41   Alk Phosphatase 39 - 117 U/L 73  44  52   Total Bilirubin 0.2 - 1.2 mg/dL 0.5  0.7  1.0   Bilirubin, Direct 0.0 - 0.3 mg/dL 0.1  0.1     Platelets 243.0  Multiple stable hepatic cysts for ten years, likely benign. Normal liver function tests, no liver disease risk factors. - Order right upper quadrant ultrasound. Consider MRI if ultrasound is abnormal.  HFpEF 01/17/22 echo EF 55 to 60% grade 1 diastolic no aortic stenosis 2023 low risk stress study  CCS  Colonoscopy 2011 unremarkable Cologuard 2023 normal  Cholelithiasis seen on imaging without inflammation Patient has no symptoms at this time, labs are normal.  -We went over symptoms of cholecystitis, cholangitis and pancreatitis that would prompt him to return to the ER if they happen.  -Advised low fat diet.  -Agrees to monitor at his time.   Diverticulosis Will call if any symptoms. Add on fiber supplement, avoid NSAIDS, information given    Patient Care Team: Joshua Debby CROME, MD as PCP - General (Internal Medicine) Floretta Mallard, MD as PCP - Cardiology (Cardiology)  HISTORY OF PRESENT ILLNESS: 78 y.o. male with a past medical history listed below presents for evaluation of liver cyst.   Discussed the use of AI scribe software  for clinical note transcription with the patient, who gave verbal consent to proceed.  History of Present Illness   The patient presents for a gastroenterology consultation regarding liver cysts and gallstones. He was referred by Dr. Floretta for further evaluation of liver cysts and gallstones.  He has a history of liver cysts first noted in 2016, with the most recent imaging showing multiple hepatic cysts. He has not experienced symptoms such as jaundice or abdominal pain related to the liver cysts. He recalls being told about a 'fatty liver' many years ago, but no further issues have been noted since then.  He has a history of gallstones discovered on a recent CT scan. No current symptoms such as abdominal pain, nausea, vomiting, fevers, or chills. He underwent a colonoscopy in 2011 and a Cologuard test in 2023. He also had a FIT test in 2023, which was normal. No significant gastrointestinal symptoms such as heartburn, nausea, or difficulty swallowing, although he occasionally takes Gas-X if he overeats.  His past medical history includes prostate cancer, for which he underwent a radical prostatectomy in 2005. He denies any personal history of heavy alcohol use, stating he consumes alcohol infrequently, about once a month.  Family history is notable for a brother who had liver issues related to alcoholism and a liver transplant, and a father who died from a  ruptured pulmonary condition. His sister had complications related to drug use and HIV.  He reports having a ventral hernia and a belly button hernia, which become more noticeable when he eats a lot. No abdominal swelling or discomfort related to these hernias.      He  reports that he has quit smoking. His smoking use included cigarettes. He does not have any smokeless tobacco history on file. He reports current alcohol use of about 7.0 standard drinks of alcohol per week. He reports that he does not use drugs.  RELEVANT GI HISTORY, IMAGING  AND LABS: Results   LABS FIT: Normal (2023)  RADIOLOGY Abdominal CT: Gallstones, multiple liver cysts, diverticulosis, no biliary dilation, normal spleen, normal pancreas  DIAGNOSTIC Echocardiogram: Low risk (2023) EKG: Irregular irregularity with occasional extra beat     10/17/2023 CTAP W0 for flank pain   1.  A 4 mm right distal ureteral calculus, causing mild upstream right hydroureteronephrosis.    2.  Few additional bilateral punctate nonobstructive nephrolithiasis.  3. Cholelithiasis.  Did show multiple liver cysts no intrahepatic biliary ductal dilation CBC    Component Value Date/Time   WBC 6.2 09/18/2023 1132   RBC 4.75 09/18/2023 1132   HGB 14.9 09/18/2023 1132   HCT 44.2 09/18/2023 1132   PLT 243.0 09/18/2023 1132   MCV 92.9 09/18/2023 1132   MCH 32.3 02/17/2015 1200   MCHC 33.7 09/18/2023 1132   RDW 14.2 09/18/2023 1132   LYMPHSABS 1.4 09/18/2023 1132   MONOABS 0.6 09/18/2023 1132   EOSABS 0.2 09/18/2023 1132   BASOSABS 0.0 09/18/2023 1132   Recent Labs    03/20/23 1034 09/18/23 1132  HGB 13.4 14.9    CMP     Component Value Date/Time   NA 141 12/08/2023 1018   K 4.6 12/08/2023 1018   CL 104 12/08/2023 1018   CO2 20 12/08/2023 1018   GLUCOSE 96 12/08/2023 1018   GLUCOSE 91 09/18/2023 1132   BUN 20 12/08/2023 1018   CREATININE 0.75 (L) 12/08/2023 1018   CALCIUM  9.7 12/08/2023 1018   PROT 7.8 03/20/2023 1034   ALBUMIN 4.4 03/20/2023 1034   AST 16 03/20/2023 1034   ALT 11 03/20/2023 1034   ALKPHOS 73 03/20/2023 1034   BILITOT 0.5 03/20/2023 1034   GFRNONAA >60 02/17/2015 1200   GFRAA >60 02/17/2015 1200      Latest Ref Rng & Units 03/20/2023   10:34 AM 12/25/2021   12:34 PM 02/17/2015   12:00 PM  Hepatic Function  Total Protein 6.0 - 8.3 g/dL 7.8  8.0  8.3   Albumin 3.5 - 5.2 g/dL 4.4  4.8  5.0   AST 0 - 37 U/L 16  31  42   ALT 0 - 53 U/L 11  30  41   Alk Phosphatase 39 - 117 U/L 73  44  52   Total Bilirubin 0.2 - 1.2 mg/dL 0.5  0.7   1.0   Bilirubin, Direct 0.0 - 0.3 mg/dL 0.1  0.1        Current Medications:    Current Outpatient Medications (Cardiovascular):    amLODipine  (NORVASC ) 5 MG tablet, TAKE 1 TABLET DAILY   losartan  (COZAAR ) 50 MG tablet, TAKE 1 TABLET DAILY   simvastatin  (ZOCOR ) 10 MG tablet, Take 1 tablet (10 mg total) by mouth at bedtime. (Patient not taking: Reported on 01/16/2024)   Current Outpatient Medications (Analgesics):    aspirin EC 81 MG tablet, Take 81 mg by mouth daily.  IBUPROFEN PO, Take 2 tablets by mouth daily.   Current Outpatient Medications (Other):    Multiple Vitamin (MULTIVITAMIN) capsule, Take 1 capsule by mouth daily.   Omega-3 Fatty Acids (FISH OIL PO), Take 1 capsule by mouth daily.   senna (SENOKOT) 8.6 MG tablet, Take 1 tablet by mouth daily.  Medical History:  Past Medical History:  Diagnosis Date   Cancer [facility])    prostate   High cholesterol    Hypertension    Allergies:  Allergies  Allergen Reactions   Crestor  [Rosuvastatin ] Other (See Comments)    Myalgia    Dilaudid [Hydromorphone Hcl] Other (See Comments)    hypotension     Surgical History:  He  has a past surgical history that includes Hernia repair; Replacement total knee; Prostate surgery; Total hip arthroplasty (Right); and carpel tunnel (Bilateral, 2023). Family History:  His family history includes Alcoholism in his brother and mother; CAD in his father; Stroke in his mother.  REVIEW OF SYSTEMS  : All other systems reviewed and negative except where noted in the History of Present Illness.  PHYSICAL EXAM: BP 110/66   Pulse 62   Ht 6' (1.829 m)   Wt 198 lb 8 oz (90 kg)   BMI 26.92 kg/m  Physical Exam   GENERAL APPEARANCE: Well nourished, in no apparent distress. HEENT: No cervical lymphadenopathy, unremarkable thyroid , sclerae anicteric, conjunctiva pink. RESPIRATORY: Respiratory effort normal, breath sounds equal bilaterally without rales, rhonchi, or wheezing. CARDIO: Irregular  irregularity of the heart rhythm, peripheral pulses intact. ABDOMEN: Soft, non-distended, active bowel sounds in all four quadrants, no tenderness to palpation, no rebound, no mass appreciated, no hepatomegaly. RECTAL: Declines. MUSCULOSKELETAL: Full range of motion, normal gait, without edema. SKIN: Dry, intact without rashes or lesions. No jaundice. NEURO: Alert, oriented, no focal deficits. PSYCH: Cooperative, normal mood and affect.      Alan JONELLE Coombs, PA-C 11:11 AM

## 2024-01-16 ENCOUNTER — Encounter: Payer: Self-pay | Admitting: Physician Assistant

## 2024-01-16 ENCOUNTER — Ambulatory Visit: Admitting: Physician Assistant

## 2024-01-16 VITALS — BP 110/66 | HR 62 | Ht 72.0 in | Wt 198.5 lb

## 2024-01-16 DIAGNOSIS — K7689 Other specified diseases of liver: Secondary | ICD-10-CM | POA: Diagnosis not present

## 2024-01-16 DIAGNOSIS — K5730 Diverticulosis of large intestine without perforation or abscess without bleeding: Secondary | ICD-10-CM | POA: Diagnosis not present

## 2024-01-16 DIAGNOSIS — K8010 Calculus of gallbladder with chronic cholecystitis without obstruction: Secondary | ICD-10-CM | POA: Diagnosis not present

## 2024-01-16 DIAGNOSIS — I5032 Chronic diastolic (congestive) heart failure: Secondary | ICD-10-CM

## 2024-01-16 NOTE — Patient Instructions (Signed)
 Diverticulosis Diverticulosis is a condition that develops when small pouches (diverticula) form in the wall of the large intestine (colon). The colon is where water is absorbed and stool (feces) is formed. The pouches form when the inside layer of the colon pushes through weak spots in the outer layers of the colon. You may have a few pouches or many of them. The pouches usually do not cause problems unless they become inflamed or infected. When this happens, the condition is called diverticulitis- this is left lower quadrant pain, diarrhea, fever, chills, nausea or vomiting.  If this occurs please call the office or go to the hospital. Sometimes these patches without inflammation can also have painless bleeding associated with them, if this happens please call the office or go to the hospital. Preventing constipation and increasing fiber can help reduce diverticula and prevent complications. Even if you feel you have a high-fiber diet, suggest getting on Benefiber or Cirtracel 2 times daily.  You have been scheduled for an abdominal ultrasound at  [facility], [address] [place], [place], [place] [address] on 01/21/2024 at 7:30 am. Please arrive 15 minutes prior to your appointment for registration. Make certain not to have anything to eat or drink 8  hours prior to your appointment. Should you need to reschedule your appointment, please contact radiology at [phone]. This test typically takes about 30 minutes to perform. Due to recent changes in healthcare laws, you may see the results of your imaging and laboratory studies on MyChart before your provider has had a chance to review them.  We understand that in some cases there may be results that are confusing or concerning to you. Not all laboratory results come back in the same time frame and the provider may be waiting for multiple results in order to interpret others.  Please give us  48 hours in order for your provider to thoroughly review all the  results before contacting the office for clarification of your results.   I appreciate the  opportunity to care for you  Thank You   [facility]

## 2024-01-19 ENCOUNTER — Encounter: Payer: Self-pay | Admitting: Internal Medicine

## 2024-01-19 ENCOUNTER — Ambulatory Visit

## 2024-01-19 ENCOUNTER — Ambulatory Visit: Admitting: Internal Medicine

## 2024-01-19 VITALS — BP 128/82 | HR 69 | Temp 97.8°F | Resp 16 | Ht 72.0 in | Wt 200.6 lb

## 2024-01-19 DIAGNOSIS — I491 Atrial premature depolarization: Secondary | ICD-10-CM

## 2024-01-19 DIAGNOSIS — I5032 Chronic diastolic (congestive) heart failure: Secondary | ICD-10-CM | POA: Diagnosis not present

## 2024-01-19 DIAGNOSIS — Z23 Encounter for immunization: Secondary | ICD-10-CM | POA: Diagnosis not present

## 2024-01-19 DIAGNOSIS — I10 Essential (primary) hypertension: Secondary | ICD-10-CM

## 2024-01-19 NOTE — Patient Instructions (Signed)

## 2024-01-19 NOTE — Progress Notes (Signed)
 Subjective:  Patient ID: Jonathan Ritter, male    DOB: [birth year]  Age: 78 y.o. MRN: [id number]  CC: Hypertension and Hyperlipidemia   HPI Jonathan Ritter presents for f/up ---  Discussed the use of AI scribe software for clinical note transcription with the patient, who gave verbal consent to proceed.  History of Present Illness Jonathan Ritter is a 78 year old male who presents for follow-up of a recent rash and cardiovascular evaluation.  He experienced a rash on his front and back, described as a 'really wild rash', which resolved within two to three days following a prolonged cold. No lingering symptoms such as sore throat, fever, chills, night sweats, or lymphadenopathy are present.  He has a history of irregular heartbeats and recently consulted with a specialist who confirmed occasional extra beats but ruled out atrial fibrillation. He does not experience symptoms like weakness, dizziness, or lightheadedness associated with the extra beats, although he can feel the irregularity when checking his pulse.  He has a history of liver cysts identified ten years ago. A recent scan showed no change, and he is scheduled for a sonogram to further evaluate them.  He was previously on simvastatin  but discontinued it due to weakness and joint stiffness, which resolved after stopping the medication. He is awaiting evaluation for an injectable medication for cholesterol management. He also mentions being taken off Jardiance .  He experiences constipation, particularly when traveling, but his bowel movements are generally normal when at home.     Outpatient Medications Prior to Visit  Medication Sig Dispense Refill   amLODipine  (NORVASC ) 5 MG tablet TAKE 1 TABLET DAILY 90 tablet 3   aspirin EC 81 MG tablet Take 81 mg by mouth daily.     IBUPROFEN PO Take 2 tablets by mouth daily.     losartan  (COZAAR ) 50 MG tablet TAKE 1 TABLET DAILY 90 tablet 3   Multiple Vitamin (MULTIVITAMIN) capsule Take 1 capsule  by mouth daily.     Omega-3 Fatty Acids (FISH OIL PO) Take 1 capsule by mouth daily.     senna (SENOKOT) 8.6 MG tablet Take 1 tablet by mouth daily.     simvastatin  (ZOCOR ) 10 MG tablet Take 1 tablet (10 mg total) by mouth at bedtime. (Patient not taking: Reported on 01/16/2024) 90 tablet 1   No facility-administered medications prior to visit.    ROS Review of Systems  Constitutional:  Negative for appetite change, chills, diaphoresis, fatigue and fever.  HENT: Negative.    Eyes: Negative.   Respiratory:  Negative for cough, chest tightness, shortness of breath and wheezing.   Cardiovascular:  Negative for chest pain, palpitations and leg swelling.  Gastrointestinal:  Positive for constipation. Negative for abdominal pain, blood in stool, diarrhea, nausea and vomiting.  Endocrine: Negative.   Genitourinary: Negative.  Negative for difficulty urinating.  Musculoskeletal: Negative.  Negative for arthralgias and myalgias.  Skin: Negative.   Neurological:  Negative for dizziness, weakness and headaches.  Hematological:  Negative for adenopathy. Does not bruise/bleed easily.  Psychiatric/Behavioral: Negative.      Objective:  BP 128/82 (BP Location: Left Arm, Patient Position: Sitting, Cuff Size: Normal)   Pulse 69   Temp 97.8 F (36.6 C) (Oral)   Resp 16   Ht 6' (1.829 m)   Wt 200 lb 9.6 oz (91 kg)   SpO2 96%   BMI 27.21 kg/m   BP Readings from Last 3 Encounters:  01/19/24 128/82  01/16/24 110/66  12/05/23 132/80    Wt  Readings from Last 3 Encounters:  01/19/24 200 lb 9.6 oz (91 kg)  01/16/24 198 lb 8 oz (90 kg)  12/05/23 193 lb (87.5 kg)    Physical Exam Vitals reviewed.  Constitutional:      Appearance: Normal appearance.  HENT:     Nose: Nose normal.     Mouth/Throat:     Mouth: Mucous membranes are moist.  Eyes:     General: No scleral icterus.    Conjunctiva/sclera: Conjunctivae normal.  Cardiovascular:     Rate and Rhythm: Normal rate and regular  rhythm. Occasional Extrasystoles are present.    Heart sounds: No murmur heard.    No friction rub. No gallop.  Pulmonary:     Effort: Pulmonary effort is normal.     Breath sounds: No stridor. No wheezing, rhonchi or rales.  Abdominal:     General: Abdomen is flat.     Palpations: There is no mass.     Tenderness: There is no abdominal tenderness. There is no guarding.     Hernia: No hernia is present.  Musculoskeletal:        General: Normal range of motion.     Cervical back: Neck supple.     Right lower leg: No edema.     Left lower leg: No edema.  Lymphadenopathy:     Cervical: No cervical adenopathy.  Skin:    General: Skin is warm and dry.  Neurological:     General: No focal deficit present.     Mental Status: He is alert.  Psychiatric:        Mood and Affect: Mood normal.        Behavior: Behavior normal.     Lab Results  Component Value Date   WBC 6.2 09/18/2023   HGB 14.9 09/18/2023   HCT 44.2 09/18/2023   PLT 243.0 09/18/2023   GLUCOSE 96 12/08/2023   CHOL 246 (H) 12/08/2023   TRIG 192 (H) 12/08/2023   HDL 47 12/08/2023   LDLCALC 164 (H) 12/08/2023   ALT 11 03/20/2023   AST 16 03/20/2023   NA 141 12/08/2023   K 4.6 12/08/2023   CL 104 12/08/2023   CREATININE 0.75 (L) 12/08/2023   BUN 20 12/08/2023   CO2 20 12/08/2023   TSH 3.48 09/18/2023    CT ANGIO CHEST AORTA W/CM & OR WO/CM Result Date: 12/10/2023 CLINICAL DATA:  Aortic dilatation. EXAM: CT ANGIOGRAPHY CHEST WITH CONTRAST TECHNIQUE: Multidetector CT imaging of the chest was performed using the standard protocol during bolus administration of intravenous contrast. Multiplanar CT image reconstructions and MIPs were obtained to evaluate the vascular anatomy. Multiplanar image (3D post-processing) reconstructions and MIPs were obtained to evaluate the vascular anatomy. RADIATION DOSE REDUCTION: This exam was performed according to the departmental dose-optimization program which includes automated  exposure control, adjustment of the mA and/or kV according to patient size and/or use of iterative reconstruction technique. CONTRAST:  80mL OMNIPAQUE  IOHEXOL  350 MG/ML SOLN COMPARISON:  CT of the abdomen performed February 17, 2015 FINDINGS: Cardiovascular: Enlarged heart. No pericardial effusion. Three-vessel aortic arch. Descending thoracic aorta is patent. Sinus of Valsalva: 40 mm x 40 mm x 40 mm Sinotubular junction: 35 mm x 34 mm Tubular ascending thoracic aorta: 41 mm x 41 mm Aortic arch: 34 mm x 32 mm Proximal descending thoracic aorta: 31 mm x 29 mm Descending thoracic aorta at the level of the aortic hiatus: 27 mm x 26 mm Main pulmonary artery: Within normal limits. Mediastinum/Nodes: No enlarged mediastinal,  hilar, or axillary lymph nodes. Thyroid  gland, trachea, and esophagus demonstrate no significant findings. Lungs/Pleura: Lungs are clear. No pleural effusion or pneumothorax. Upper Abdomen: Cystic changes are present in the liver which are similar when compared to CT scan performed February 17, 2015. Musculoskeletal: Degenerative changes are present in the imaged osseous structures. Review of the MIP images confirms the above findings. IMPRESSION: 1. 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 Recommend annual imaging followup by CTA or MRA. This recommendation follows 2010 ACCF/AHA/AATS/ACR/ASA/SCA/SCAI/SIR/STS/SVM Guidelines for the Diagnosis and Management of Patients with Thoracic Aortic Disease. Circulation. 2010; 121: [id number]. Aortic aneurysm NOS (ICD10-I71.9) Electronically Signed   By: Maude Naegeli M.D.   On: 12/10/2023 10:27    Assessment & Plan:  Need for immunization against influenza -     Flu vaccine HIGH DOSE PF(Fluzone Trivalent)  Chronic heart failure with preserved ejection fraction (HCC)- No sings of fluid excess.  Premature atrial contractions- He is asx.  Essential hypertension- BP is well controlled.     Follow-up: Return in about 4 months  (around 05/21/2024).  Debby Molt, MD

## 2024-01-21 ENCOUNTER — Ambulatory Visit: Payer: Self-pay | Admitting: Physician Assistant

## 2024-01-21 ENCOUNTER — Ambulatory Visit (HOSPITAL_COMMUNITY)
Admission: RE | Admit: 2024-01-21 | Discharge: 2024-01-21 | Disposition: A | Discharge status: Home / Self Care | Source: Ambulatory Visit | Attending: Physician Assistant | Admitting: Physician Assistant

## 2024-01-21 DIAGNOSIS — K7689 Other specified diseases of liver: Secondary | ICD-10-CM

## 2024-01-21 DIAGNOSIS — K769 Liver disease, unspecified: Secondary | ICD-10-CM

## 2024-01-22 ENCOUNTER — Telehealth: Payer: Self-pay | Admitting: Physician Assistant

## 2024-01-22 DIAGNOSIS — K769 Liver disease, unspecified: Secondary | ICD-10-CM

## 2024-01-22 NOTE — Telephone Encounter (Signed)
 Inbound call fro patient stating he would like to have his MRI at [facility] imaging because he is claustrophobic. Requesting a call back to discuss. Please advise.

## 2024-01-22 NOTE — Telephone Encounter (Signed)
 Called & spoke with patient. Informed patient that I have updated MRI order to be completed at [facility] & I have requested an open MRI. Patient knows to expect a call from [facility] imaging to schedule his appt. Pt verbalized understanding & had no concerns at the end of the call.

## 2024-01-27 NOTE — Progress Notes (Unsigned)
 Patient ID: Jonathan Ritter                 DOB: 22-Sep-1945                    MRN: [id number]      HPI: Jonathan Ritter is a 78 y.o. male patient referred to lipid clinic by  Dr. Georganna Archer. PMH is significant for HTN, HLD, HFpEF, premature atrial contractions, and chronic liver cysts.  Patient was last seen by Dr. Archer on 12/05/2023 for concern of Afib. Patient was not in atrial fibrillation, but a lipid panel was ordered as the patient recently stopped taking his statin due to myalgias. LDL was 164 and lipoprotein (a) was <8.4. Because of his statin intolerances he was referred to lipid clinic.  Reviewed PCSK-9 inhibitors.  Discussed mechanisms of action, dosing, side effects and potential decreases in LDL cholesterol.  Also reviewed cost information.   Current Medications: omega-3 Intolerances: Rosuvastatin , simvastatin , simvastatin /ezetimibe (myalgias) Risk Factors: age, HTN, HLD LDL-C goal: <70  Exercise: Patient stated he uses a weight machine for 30 minutes/day 6 days a week  Diet:  Breakfast: English muffin, egg, oatmeal Lunch: Turkey sandwich, protein shakes  Dinner: chicken, pork, potatoes, salads/vegetables Beverages: Mostly water, occasional sweet tea/soda   Family History:  Mother: Stroke Father: CAD Social History:  Former smoker (quit in 1970) Denies alcohol use  Labs: Lipid Panel     Component Value Date/Time   CHOL 246 (H) 12/08/2023 1018   TRIG 192 (H) 12/08/2023 1018   HDL 47 12/08/2023 1018   CHOLHDL 5.2 (H) 12/08/2023 1018   CHOLHDL 3 03/20/2023 1034   VLDL 59.4 (H) 03/20/2023 1034   LDLCALC 164 (H) 12/08/2023 1018   LABVLDL 35 12/08/2023 1018    Past Medical History:  Diagnosis Date   Cancer (HCC)    prostate   High cholesterol    Hypertension     Current Outpatient Medications on File Prior to Visit  Medication Sig Dispense Refill   amLODipine  (NORVASC ) 5 MG tablet TAKE 1 TABLET DAILY 90 tablet 3   aspirin EC 81 MG tablet Take 81 mg by  mouth daily.     IBUPROFEN PO Take 2 tablets by mouth daily.     losartan  (COZAAR ) 50 MG tablet TAKE 1 TABLET DAILY 90 tablet 3   Multiple Vitamin (MULTIVITAMIN) capsule Take 1 capsule by mouth daily.     Omega-3 Fatty Acids (FISH OIL PO) Take 2 capsules by mouth daily.     senna (SENOKOT) 8.6 MG tablet Take 1 tablet by mouth daily.     No current facility-administered medications on file prior to visit.    Allergies  Allergen Reactions   Crestor  [Rosuvastatin ] Other (See Comments)    Myalgia    Dilaudid [Hydromorphone Hcl] Other (See Comments)    hypotension    Assessment/Plan:  1. Hyperlipidemia -  Hyperlipidemia Assessment: Most recent LDL was 164 (12/05/2023) Patient has experienced myalgias on several statins.  At this time, current medications for hyperlipidemia include Omega-3.  Patient is agreeable to start Repatha  Plan: Will submit PA for Repatha Patient was educated on injection technique and questions were answered regarding side effects.  As patient will be going on vacation thru 11/23, will send Repatha prescription to [facility] closer to patient's return. Follow-up lipid panel in 3 months    Thank you,  B. Amon Rocher, PharmD PGY-1 Pharmacy Resident [facility] 01/28/2024 2:30 PM   Melissa D Maccia, Pharm.D,  BCACP, CPP [facility] [address], [place], [place] [address]  Phone: [phone]; Fax: [phone]

## 2024-01-28 ENCOUNTER — Telehealth: Payer: Self-pay | Admitting: Pharmacy Technician

## 2024-01-28 ENCOUNTER — Other Ambulatory Visit (HOSPITAL_COMMUNITY): Payer: Self-pay

## 2024-01-28 ENCOUNTER — Ambulatory Visit: Attending: Cardiology | Admitting: Pharmacist

## 2024-01-28 DIAGNOSIS — E785 Hyperlipidemia, unspecified: Secondary | ICD-10-CM | POA: Diagnosis present

## 2024-01-28 NOTE — Patient Instructions (Signed)

## 2024-01-28 NOTE — Telephone Encounter (Signed)
 Pharmacy Patient Advocate Encounter   Received notification from Physician's Office that prior authorization for repatha is required/requested.   Insurance verification completed.   The patient is insured through [employer].   Per test claim: The current 01/28/24 day co-pay is, $43.00.  No PA needed at this time. This test claim was processed through [facility]- copay amounts may vary at other pharmacies due to pharmacy/plan contracts, or as the patient moves through the different stages of their insurance plan.

## 2024-01-28 NOTE — Assessment & Plan Note (Addendum)
 Assessment: Most recent LDL was 164 (12/05/2023) Patient has experienced myalgias on several statins.  At this time, current medications for hyperlipidemia include Omega-3.  Patient is agreeable to start Repatha  Plan: Will submit PA for Repatha Patient was educated on injection technique and questions were answered regarding side effects.  As patient will be going on vacation thru 11/23, will send Repatha prescription to [facility] closer to patient's return. Follow-up lipid panel in 3 months

## 2024-01-30 ENCOUNTER — Encounter: Payer: Self-pay | Admitting: Pharmacist

## 2024-02-16 MED ORDER — REPATHA SURECLICK 140 MG/ML ~~LOC~~ SOAJ: 1.0000 mL | SUBCUTANEOUS | 3 refills | Status: AC

## 2024-02-22 ENCOUNTER — Ambulatory Visit
Admission: RE | Admit: 2024-02-22 | Discharge: 2024-02-22 | Disposition: A | Source: Ambulatory Visit | Attending: Physician Assistant | Admitting: Physician Assistant

## 2024-02-22 DIAGNOSIS — K769 Liver disease, unspecified: Secondary | ICD-10-CM

## 2024-02-22 MED ORDER — GADOPICLENOL 0.5 MMOL/ML IV SOLN
10.0000 mL | Freq: Once | INTRAVENOUS | Status: AC | PRN
  Administered 2024-02-22: 9 mL via INTRAVENOUS

## 2024-02-23 ENCOUNTER — Ambulatory Visit: Payer: Self-pay | Admitting: Physician Assistant

## 2024-03-26 ENCOUNTER — Encounter: Payer: Self-pay | Admitting: Internal Medicine

## 2024-03-30 ENCOUNTER — Ambulatory Visit: Payer: Self-pay

## 2024-03-30 ENCOUNTER — Ambulatory Visit

## 2024-03-30 NOTE — Telephone Encounter (Signed)
 FYI Only or Action Required?: FYI only for provider: appointment scheduled on tomorrwo.  Patient was last seen in primary care on 01/19/2024 by Joshua Debby CROME, MD.  Called Nurse Triage reporting Shoulder Pain.  Symptoms began a week ago.  Interventions attempted: OTC medications: IBU aldo rubs and patches, heat pack and red light therapy.  Symptoms are: unchanged.  Triage Disposition: See PCP When Office is Open (Within 3 Days)  Patient/caregiver understands and will follow disposition?: Yes                    Copied from CRM #[id number]. Topic: Clinical - Red Word Triage >> Mar 30, 2024  9:21 AM Deleta RAMAN wrote: Red Word that prompted transfer to Nurse Triage: neck, shoulder, and back pain. Comes and goes tend to be ongoing since 03/26/24. Lower back is worsening. Pain can be a 7 Reason for Disposition  [1] MODERATE pain (e.g., interferes with normal activities) AND [2] present > 3 days  Answer Assessment - Initial Assessment Questions 1. ONSET: When did the pain start?     03/26/2024 2. LOCATION: Where is the pain located?     Neck, shoulder, and back 3. PAIN: How bad is the pain? (Scale 1-10; or mild, moderate, severe)     moderate 4. WORK OR EXERCISE: Has there been any recent work or exercise that involved this part of the body?     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 CAUSE: What do you think is causing the shoulder pain?     Over use 6. OTHER SYMPTOMS: Do you have any other symptoms? (e.g., neck pain, swelling, rash, fever, numbness, weakness)     Pt has pain in his back when laying down and shoulder and neck pain when sitting  Protocols used: Shoulder Pain-A-AH

## 2024-03-31 ENCOUNTER — Ambulatory Visit (INDEPENDENT_AMBULATORY_CARE_PROVIDER_SITE_OTHER): Admitting: Emergency Medicine

## 2024-03-31 ENCOUNTER — Encounter: Payer: Self-pay | Admitting: Emergency Medicine

## 2024-03-31 VITALS — BP 140/80 | HR 82 | Temp 98.2°F | Ht 72.0 in | Wt 202.0 lb

## 2024-03-31 DIAGNOSIS — G8929 Other chronic pain: Secondary | ICD-10-CM

## 2024-03-31 DIAGNOSIS — M25511 Pain in right shoulder: Secondary | ICD-10-CM | POA: Diagnosis not present

## 2024-03-31 NOTE — Assessment & Plan Note (Signed)
 Chronic intermittent pains to both shoulders and neck Clinically stable.  No red flag signs or symptoms. Pain management discussed. Requesting referral to physical therapy Referral placed today Needs to follow-up with PCP as well

## 2024-03-31 NOTE — Progress Notes (Signed)
 Jonathan Ritter 79 y.o.   Chief Complaint  Patient presents with   Back Pain    Neck and shoulder pain, pt states that he has been dealing with this for a couple of weeks     HISTORY OF PRESENT ILLNESS: Acute problem visit today. This is a 79 y.o. male complaining of pain to neck and shoulders for the past couple of weeks. Has history of chronic pain to both shoulders right more than left Today feels okay. Needs referral to physical therapy  Back Pain Pertinent negatives include no abdominal pain, chest pain, dysuria, fever or headaches.     Prior to Admission medications  Medication Sig Start Date End Date Taking? Authorizing Provider  amLODipine  (NORVASC ) 5 MG tablet TAKE 1 TABLET DAILY 10/14/23  Yes Joshua Debby CROME, MD  aspirin EC 81 MG tablet Take 81 mg by mouth daily.   Yes [provider]  Evolocumab  (REPATHA  SURECLICK) 140 MG/ML SOAJ Inject 140 mg into the skin every 14 (fourteen) days. 02/16/24  Yes Floretta Mallard, MD  IBUPROFEN PO Take 2 tablets by mouth daily.   Yes [provider]  losartan  (COZAAR ) 50 MG tablet TAKE 1 TABLET DAILY 09/19/23  Yes Joshua Debby CROME, MD  Multiple Vitamin (MULTIVITAMIN) capsule Take 1 capsule by mouth daily.   Yes [provider]  Omega-3 Fatty Acids (FISH OIL PO) Take 2 capsules by mouth daily.   Yes [provider]  senna (SENOKOT) 8.6 MG tablet Take 1 tablet by mouth daily.   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   Tailor's bunion of right foot 10/14/2022   Statin myopathy 08/05/2022   Chronic heart failure with preserved ejection fraction (HCC) 01/25/2022   Ejection  fraction < 50% 01/04/2022   Hyperlipidemia with target LDL less than 130 12/25/2021   Abnormal electrocardiogram (ECG) (EKG) 12/25/2021   Flu vaccine need 12/25/2021   Cervical disc disorder at C5-C6 level with radiculopathy 12/05/2019   Cervical spinal stenosis 12/05/2019   Bilateral carpal tunnel syndrome 10/18/2019   Neuroforaminal stenosis of cervical spine 10/18/2019   Essential hypertension 06/12/2017    Past Medical History:  Diagnosis Date   Cancer (HCC)    prostate   High cholesterol    Hypertension     Past Surgical History:  Procedure Laterality Date   carpel tunnel Bilateral 2023   HERNIA REPAIR     PROSTATE SURGERY     REPLACEMENT TOTAL KNEE     TOTAL HIP ARTHROPLASTY Right     Social History   Socioeconomic History   Marital status: Married    Spouse name: Candis   Number of children: 2   Years of education: Not on file   Highest education level: Master's degree (e.g., MA, MS, MEng, MEd, MSW, MBA)  Occupational History   Occupation: Retired  Tobacco Use   Smoking status: Former    Types: Cigarettes   Smokeless tobacco: Not on file  Vaping Use   Vaping status: Never Used  Substance and Sexual Activity   Alcohol use: Yes    Alcohol/week: 7.0 standard drinks of alcohol    Types: 7 Glasses of wine per week    Comment: occasional  Drug use: No   Sexual activity: Not Currently    Partners: Female  Other Topics Concern   Not on file  Social History Narrative   Not on file   Social Drivers of Health   Tobacco Use: Medium Risk (03/31/2024)   Patient History    Smoking Tobacco Use: Former    Smokeless Tobacco Use: Unknown    Passive Exposure: Not on file  Financial Resource Strain: Low Risk (01/15/2024)   Overall Financial Resource Strain (CARDIA)    Difficulty of Paying Living Expenses: Not hard at all  Food Insecurity: No Food Insecurity (01/15/2024)   Epic    Worried About Radiation Protection Practitioner of Food in the Last Year: Never true    Ran Out of Food in  the Last Year: Never true  Transportation Needs: No Transportation Needs (01/15/2024)   Epic    Lack of Transportation (Medical): No    Lack of Transportation (Non-Medical): No  Physical Activity: Sufficiently Active (01/15/2024)   Exercise Vital Sign    Days of Exercise per Week: 6 days    Minutes of Exercise per Session: 40 min  Stress: No Stress Concern Present (01/15/2024)   [employer] of Occupational Health - Occupational Stress Questionnaire    Feeling of Stress: Only a little  Social Connections: Moderately Integrated (01/15/2024)   Social Connection and Isolation Panel    Frequency of Communication with Friends and Family: Once a week    Frequency of Social Gatherings with Friends and Family: Once a week    Attends Religious Services: More than 4 times per year    Active Member of [employer] or Organizations: Yes    Attends Banker Meetings: 1 to 4 times per year    Marital Status: Married  Catering Manager Violence: Not At Risk (10/14/2022)   Humiliation, Afraid, Rape, and Kick questionnaire    Fear of Current or Ex-Partner: No    Emotionally Abused: No    Physically Abused: No    Sexually Abused: No  Depression (PHQ2-9): Low Risk (01/19/2024)   Depression (PHQ2-9)    PHQ-2 Score: 0  Alcohol Screen: Low Risk (01/15/2024)   Alcohol Screen    Last Alcohol Screening Score (AUDIT): 1  Housing: Low Risk (01/15/2024)   Epic    Unable to Pay for Housing in the Last Year: No    Number of Times Moved in the Last Year: 0    Homeless in the Last Year: No  Utilities: Patient Declined (10/14/2022)   [facility] Utilities    Threatened with loss of utilities: Patient declined  Health Literacy: Patient Declined (10/14/2022)   B1300 Health Literacy    Frequency of need for help with medical instructions: Patient declines to respond    Family History  Problem Relation Age of Onset   Alcoholism Mother    Stroke Mother    CAD Father    Alcoholism Brother      Review of  Systems  Constitutional: Negative.  Negative for chills and fever.  HENT: Negative.  Negative for congestion and sore throat.   Respiratory: Negative.  Negative for cough and hemoptysis.   Cardiovascular: Negative.  Negative for chest pain and palpitations.  Gastrointestinal:  Negative for abdominal pain, diarrhea, nausea and vomiting.  Genitourinary: Negative.  Negative for dysuria and hematuria.  Musculoskeletal:  Positive for back pain and neck pain.  Skin: Negative.  Negative for rash.  Neurological: Negative.  Negative for dizziness and headaches.  All other systems reviewed and are negative.  Today's Vitals   03/31/24 1555  BP: (!) 140/80  Pulse: 82  Temp: 98.2 F (36.8 C)  TempSrc: Oral  SpO2: 97%  Weight: 202 lb (91.6 kg)  Height: 6' (1.829 m)   Body mass index is 27.4 kg/m.   Physical Exam Vitals reviewed.  Constitutional:      Appearance: Normal appearance.  HENT:     Head: Normocephalic.  Eyes:     Extraocular Movements: Extraocular movements intact.  Cardiovascular:     Rate and Rhythm: Normal rate.  Pulmonary:     Effort: Pulmonary effort is normal.  Skin:    General: Skin is warm and dry.  Neurological:     Mental Status: He is alert and oriented to person, place, and time.  Psychiatric:        Mood and Affect: Mood normal.        Behavior: Behavior normal.      ASSESSMENT & PLAN: I personally spent a total of 30 minutes minutes in the care of the patient today including preparing to see the patient, getting/reviewing separately obtained history, performing a medically appropriate exam/evaluation, counseling and educating, placing orders, referring and communicating with other health care professionals, documenting clinical information in the EHR, and coordinating care.  Problem List Items Addressed This Visit       Other   Chronic right shoulder pain - Primary   Chronic intermittent pains to both shoulders and neck Clinically stable.  No red  flag signs or symptoms. Pain management discussed. Requesting referral to physical therapy Referral placed today Needs to follow-up with PCP as well      Relevant Orders   Ambulatory referral to Physical Therapy   Patient Instructions  Shoulder Pain Many things can cause shoulder pain, including: An injury. Moving the shoulder in the same way again and again (overuse). Joint pain (arthritis). Pain can come from: Swelling and irritation (inflammation) of any part of the shoulder. An injury to: The shoulder joint. Tissues that connect muscle to bone (tendons). Tissues that connect bones to each other (ligaments). Bones. Follow these instructions at home: Watch for changes in your symptoms. Let your doctor know about them. Follow these instructions to help with your pain. If you have a sling that can be taken off: Wear the sling as told by your doctor. Take it off only as told by your doctor. Check the skin around the sling every day. Tell your doctor if you see problems. Loosen the sling if your fingers: Tingle. Become numb. Become cold. Keep the sling clean. If the sling is not waterproof: Do not let it get wet. Take the sling off when you shower or bathe. Managing pain, stiffness, and swelling  If told, put ice on the painful area. Put ice in a plastic bag. Place a towel between your skin and the bag. Leave the ice on for 20 minutes, 2-3 times a day. Stop putting ice on if it does not help with the pain. If your skin turns bright red, take off the ice right away to prevent skin damage. The risk of damage is higher if you cannot feel pain, heat, or cold. Squeeze a soft ball or a foam pad as much as possible. This prevents swelling in the shoulder. It also helps to strengthen the arm. General instructions Take over-the-counter and prescription medicines only as told by your doctor. Keep all follow-up visits. This will help you avoid any type of permanent shoulder  problems. Contact a doctor if: Your pain  gets worse. Medicine does not help your pain. You have new pain in your arm, hand, or fingers. You loosen your sling and your arm, hand, or fingers: Tingle. Are numb. Are swollen. Get help right away if: Your arm, hand, or fingers turn white or blue. This information is not intended to replace advice given to you by your health care provider. Make sure you discuss any questions you have with your health care provider. Document Revised: 10/12/2021 Document Reviewed: 10/12/2021 Elsevier Patient Education  2024 Elsevier Inc.   Emil Schaumann, MD [facility]    [1]  Allergies Allergen Reactions   Crestor  [Rosuvastatin ] Other (See Comments)    Myalgia    Dilaudid [Hydromorphone Hcl] Other (See Comments)    hypotension

## 2024-03-31 NOTE — Patient Instructions (Signed)
 Shoulder Pain  Many things can cause shoulder pain, including:  An injury.  Moving the shoulder in the same way again and again (overuse).  Joint pain (arthritis).  Pain can come from:  Swelling and irritation (inflammation) of any part of the shoulder.  An injury to:  The shoulder joint.  Tissues that connect muscle to bone (tendons).  Tissues that connect bones to each other (ligaments).  Bones.  Follow these instructions at home:  Watch for changes in your symptoms. Let your doctor know about them. Follow these instructions to help with your pain.  If you have a sling that can be taken off:  Wear the sling as told by your doctor. Take it off only as told by your doctor.  Check the skin around the sling every day. Tell your doctor if you see problems.  Loosen the sling if your fingers:  Tingle.  Become numb.  Become cold.  Keep the sling clean.  If the sling is not waterproof:  Do not let it get wet.  Take the sling off when you shower or bathe.  Managing pain, stiffness, and swelling    If told, put ice on the painful area.  Put ice in a plastic bag.  Place a towel between your skin and the bag.  Leave the ice on for 20 minutes, 2-3 times a day. Stop putting ice on if it does not help with the pain.  If your skin turns bright red, take off the ice right away to prevent skin damage. The risk of damage is higher if you cannot feel pain, heat, or cold.  Squeeze a soft ball or a foam pad as much as possible. This prevents swelling in the shoulder. It also helps to strengthen the arm.  General instructions  Take over-the-counter and prescription medicines only as told by your doctor.  Keep all follow-up visits. This will help you avoid any type of permanent shoulder problems.  Contact a doctor if:  Your pain gets worse.  Medicine does not help your pain.  You have new pain in your arm, hand, or fingers.  You loosen your sling and your arm, hand, or fingers:  Tingle.  Are numb.  Are swollen.  Get help right away  if:  Your arm, hand, or fingers turn white or blue.  This information is not intended to replace advice given to you by your health care provider. Make sure you discuss any questions you have with your health care provider.  Document Revised: 10/12/2021 Document Reviewed: 10/12/2021  Elsevier Patient Education  2024 [employer].

## 2024-04-06 NOTE — Telephone Encounter (Signed)
 Copied from CRM [id number]. Topic: Referral - Status >> Apr 06, 2024  1:18 PM Zy'onna H wrote: Reason for CRM: Patient called in to inquiring about his current physical therapy referral this is in Pending Review Status  He was advised by the Nurse of PCP on his visit 03/31/2024 - that in 4 business days they'd reach out to the patient for further information.  He hadn't heard back yet.  At this time the patient has not selected a clinic for the referral to be sent to he is currently in [place] and would like to consult with Care team on selection.  Please Advise

## 2024-04-08 NOTE — Telephone Encounter (Signed)
 Patient was seen by Dr. Purcell and a referral for PT was sent in for him. He was advised by Lex that they would reach out to him in the next 4-5 days about his referral. I advised the patient that this referral was placed as ROUTINE and not URGENT or STAT so it would possibly take at least 2-4 weeks for somebody to call to have him scheduled. Patient advised me that he was no longer in pain and would wait patiently for an appointment with PT. I gave him a verbal understanding.

## 2024-06-21 ENCOUNTER — Ambulatory Visit: Admitting: Internal Medicine
